# Patient Record
Sex: Female | Born: 1960 | Race: Black or African American | Hispanic: No | Marital: Single | Smoking: Never smoker
Health system: Southern US, Community
[De-identification: ages and names within clinical notes are randomized; demographics above are authoritative.]

## PROBLEM LIST (undated history)

## (undated) DIAGNOSIS — K5792 Diverticulitis of intestine, part unspecified, without perforation or abscess without bleeding: Secondary | ICD-10-CM

## (undated) DIAGNOSIS — R9439 Abnormal result of other cardiovascular function study: Secondary | ICD-10-CM

## (undated) DIAGNOSIS — E782 Mixed hyperlipidemia: Secondary | ICD-10-CM

## (undated) DIAGNOSIS — I422 Other hypertrophic cardiomyopathy: Secondary | ICD-10-CM

## (undated) DIAGNOSIS — I517 Cardiomegaly: Secondary | ICD-10-CM

## (undated) DIAGNOSIS — F419 Anxiety disorder, unspecified: Secondary | ICD-10-CM

## (undated) HISTORY — DX: Anxiety disorder, unspecified: F41.9

## (undated) HISTORY — DX: Cardiomegaly: I51.7

## (undated) HISTORY — PX: ABDOMINAL HYSTERECTOMY: SUR658

## (undated) HISTORY — DX: Mixed hyperlipidemia: E78.2

## (undated) HISTORY — DX: Diverticulitis of intestine, part unspecified, without perforation or abscess without bleeding: K57.92

## (undated) HISTORY — PX: CHOLECYSTECTOMY, LAPAROSCOPIC: SHX56

## (undated) HISTORY — DX: Other hypertrophic cardiomyopathy: I42.2

## (undated) HISTORY — PX: TOOTH EXTRACTION: SUR596

## (undated) HISTORY — DX: Abnormal result of other cardiovascular function study: R94.39

---

## 2004-09-22 ENCOUNTER — Ambulatory Visit (HOSPITAL_COMMUNITY): Admission: RE | Admit: 2004-09-22 | Discharge: 2004-09-22 | Payer: Self-pay | Admitting: Gastroenterology

## 2004-11-17 ENCOUNTER — Inpatient Hospital Stay (HOSPITAL_COMMUNITY): Admission: EM | Admit: 2004-11-17 | Discharge: 2004-11-20 | Payer: Self-pay | Admitting: Emergency Medicine

## 2018-03-24 ENCOUNTER — Other Ambulatory Visit (HOSPITAL_COMMUNITY): Payer: Self-pay | Admitting: Family Medicine

## 2018-03-24 DIAGNOSIS — R011 Cardiac murmur, unspecified: Secondary | ICD-10-CM

## 2018-03-27 ENCOUNTER — Ambulatory Visit (HOSPITAL_COMMUNITY)
Admission: RE | Admit: 2018-03-27 | Discharge: 2018-03-27 | Disposition: A | Discharge status: Home / Self Care | Payer: BC Managed Care – PPO | Source: Ambulatory Visit | Attending: Family Medicine | Admitting: Family Medicine

## 2018-03-27 DIAGNOSIS — I340 Nonrheumatic mitral (valve) insufficiency: Secondary | ICD-10-CM | POA: Diagnosis not present

## 2018-03-27 DIAGNOSIS — R011 Cardiac murmur, unspecified: Secondary | ICD-10-CM | POA: Insufficient documentation

## 2018-03-27 NOTE — Progress Notes (Signed)
*   Echocardiogram 2D Echocardiogram has been performed.  Ramey Schiff L Androw 03/27/2018, 10:58 AM

## 2018-04-07 ENCOUNTER — Other Ambulatory Visit (HOSPITAL_COMMUNITY): Payer: Self-pay | Admitting: Family Medicine

## 2018-04-07 DIAGNOSIS — R931 Abnormal findings on diagnostic imaging of heart and coronary circulation: Secondary | ICD-10-CM

## 2018-04-17 ENCOUNTER — Other Ambulatory Visit (HOSPITAL_COMMUNITY): Payer: BC Managed Care – PPO

## 2018-04-23 ENCOUNTER — Ambulatory Visit (HOSPITAL_COMMUNITY)
Admission: RE | Admit: 2018-04-23 | Discharge: 2018-04-23 | Disposition: A | Discharge status: Home / Self Care | Payer: BC Managed Care – PPO | Source: Ambulatory Visit | Attending: Family Medicine | Admitting: Family Medicine

## 2018-04-23 DIAGNOSIS — R931 Abnormal findings on diagnostic imaging of heart and coronary circulation: Secondary | ICD-10-CM | POA: Insufficient documentation

## 2018-04-23 DIAGNOSIS — I517 Cardiomegaly: Secondary | ICD-10-CM | POA: Diagnosis not present

## 2018-04-23 DIAGNOSIS — I340 Nonrheumatic mitral (valve) insufficiency: Secondary | ICD-10-CM | POA: Diagnosis not present

## 2018-04-23 MED ORDER — GADOBUTROL 1 MMOL/ML IV SOLN
10.0000 mL | Freq: Once | INTRAVENOUS | Status: AC | PRN
  Administered 2018-04-23: 10 mL via INTRAVENOUS

## 2018-07-24 ENCOUNTER — Other Ambulatory Visit (HOSPITAL_COMMUNITY): Payer: Self-pay | Admitting: Cardiology

## 2018-07-24 DIAGNOSIS — R9430 Abnormal result of cardiovascular function study, unspecified: Secondary | ICD-10-CM

## 2018-08-01 ENCOUNTER — Other Ambulatory Visit (HOSPITAL_COMMUNITY): Payer: Self-pay | Admitting: Cardiology

## 2018-08-02 LAB — BASIC METABOLIC PANEL
BUN / CREAT RATIO: 15 (ref 9–23)
BUN: 12 mg/dL (ref 6–24)
CALCIUM: 9.6 mg/dL (ref 8.7–10.2)
CO2: 26 mmol/L (ref 20–29)
Chloride: 103 mmol/L (ref 96–106)
Creatinine, Ser: 0.82 mg/dL (ref 0.57–1.00)
GFR calc non Af Amer: 79 mL/min/{1.73_m2} (ref >59)
GFR, EST AFRICAN AMERICAN: 91 mL/min/{1.73_m2} (ref >59)
GLUCOSE: 102 mg/dL — AB (ref 65–99)
POTASSIUM: 4.2 mmol/L (ref 3.5–5.2)
Sodium: 142 mmol/L (ref 134–144)

## 2018-08-05 ENCOUNTER — Telehealth (HOSPITAL_COMMUNITY): Payer: Self-pay | Admitting: Emergency Medicine

## 2018-08-05 NOTE — Telephone Encounter (Signed)
Reaching out to patient to offer assistance regarding upcoming cardiac imaging study; pt verbalizes understanding of appt date/time, parking situation and where to check in, pre-test NPO status and medications ordered, and verified current allergies; name and call back number provided for further questions should they arise Mychal Decarlo RN Navigator Cardiac [facility] [phone] office [phone] cell 

## 2018-08-07 ENCOUNTER — Ambulatory Visit (HOSPITAL_COMMUNITY)
Admission: RE | Admit: 2018-08-07 | Discharge: 2018-08-07 | Disposition: A | Discharge status: Home / Self Care | Payer: BC Managed Care – PPO | Source: Ambulatory Visit | Attending: Cardiology | Admitting: Cardiology

## 2018-08-07 ENCOUNTER — Encounter (HOSPITAL_COMMUNITY): Payer: Self-pay

## 2018-08-07 DIAGNOSIS — R9430 Abnormal result of cardiovascular function study, unspecified: Secondary | ICD-10-CM | POA: Insufficient documentation

## 2018-08-07 MED ORDER — NITROGLYCERIN 0.4 MG SL SUBL
SUBLINGUAL_TABLET | SUBLINGUAL | Status: AC
  Administered 2018-08-07: 0.8 mg via SUBLINGUAL
  Filled 2018-08-07: qty 2

## 2018-08-07 MED ORDER — NITROGLYCERIN 0.4 MG SL SUBL
0.8000 mg | SUBLINGUAL_TABLET | Freq: Once | SUBLINGUAL | Status: AC
  Administered 2018-08-07: 0.8 mg via SUBLINGUAL
  Filled 2018-08-07: qty 25

## 2018-08-07 MED ORDER — METOPROLOL TARTRATE 5 MG/5ML IV SOLN
INTRAVENOUS | Status: AC
  Filled 2018-08-07: qty 5

## 2018-08-07 MED ORDER — METOPROLOL TARTRATE 5 MG/5ML IV SOLN
5.0000 mg | INTRAVENOUS | Status: DC | PRN
  Administered 2018-08-07: 5 mg via INTRAVENOUS
  Filled 2018-08-07: qty 5

## 2018-08-07 MED ORDER — IOPAMIDOL (ISOVUE-370) INJECTION 76%
80.0000 mL | Freq: Once | INTRAVENOUS | Status: AC | PRN
  Administered 2018-08-07: 80 mL via INTRAVENOUS

## 2018-08-08 ENCOUNTER — Encounter: Payer: Self-pay | Admitting: Cardiology

## 2018-08-08 ENCOUNTER — Ambulatory Visit: Payer: BC Managed Care – PPO | Admitting: Cardiology

## 2018-08-08 VITALS — BP 116/80 | HR 76 | Ht 66.0 in | Wt 218.3 lb

## 2018-08-08 DIAGNOSIS — E782 Mixed hyperlipidemia: Secondary | ICD-10-CM | POA: Diagnosis not present

## 2018-08-08 DIAGNOSIS — I422 Other hypertrophic cardiomyopathy: Secondary | ICD-10-CM | POA: Diagnosis not present

## 2018-08-08 DIAGNOSIS — F419 Anxiety disorder, unspecified: Secondary | ICD-10-CM

## 2018-08-08 MED ORDER — DILTIAZEM HCL ER COATED BEADS 120 MG PO CP24: 120.0000 mg | ORAL_CAPSULE | Freq: Every day | ORAL | 3 refills | Status: DC

## 2018-08-08 MED ORDER — PROPRANOLOL HCL 20 MG PO TABS
20.0000 mg | ORAL_TABLET | Freq: Two times a day (BID) | ORAL | 3 refills | Status: DC | PRN
Start: 2018-08-08 — End: 2020-01-29

## 2018-08-08 NOTE — Patient Instructions (Addendum)
Take diltiazem 120 mg every day. Take propranolol 20 mg twice daily as needed for fast heart rate or anxiety. Stop aspirin. Stop metoprolol  Keep yourself well hydrated. Walk every day.  I will see you back in 3 months

## 2018-08-08 NOTE — Progress Notes (Signed)
Patient is here for follow up visit.  Subjective:   @Patient  ID: Tina Houston, female    DOB: Sep 16, 1960, 58 y.o.   MRN: [id number]  Chief Complaint  Patient presents with  . Cardiomyopathy    3 month F/U    HPI    58 year old African-American female with hypertrophic cardiomyopathy, hyperlipidemia, here for follow up.  Patient underwent exercise treadmill stress test that showed no arrhythmias, but did show diffuse ST-T changes, with low level exercise. Due to this, I recommended coronary CTA that showed no CAD.  Patient is here to discuss the results. She denies chest pain, shortness of breath, palpitations, leg edema, orthopnea, PND, TIA/syncope. She has had significant anxiety since she has been diagnosed with HCM.  She has requested her siblings to be screened for HCM. Patient was taking metoprolol tartarate 25 mg daily, instead of metoprolol succinate. She has noticed sleepiness since starting metoprolol.     Past Medical History:  Diagnosis Date  . Abnormal stress ECG with treadmill   . Anxiety   . Diverticulitis   . Hypertrophic cardiomegaly   . Mixed hyperlipidemia     Past Surgical History:  Procedure Laterality Date  . ABDOMINAL HYSTERECTOMY    . CHOLECYSTECTOMY, LAPAROSCOPIC      Social History   Socioeconomic History  . Marital status: Single    Spouse name: Not on file  . Number of children: Not on file  . Years of education: Not on file  . Highest education level: Not on file  Occupational History  . Not on file  Social Needs  . Financial resource strain: Not on file  . Food insecurity:    Worry: Not on file    Inability: Not on file  . Transportation needs:    Medical: Not on file    Non-medical: Not on file  Tobacco Use  . Smoking status: Never Smoker  . Smokeless tobacco: Never Used  Substance and Sexual Activity  . Alcohol use: Yes    Frequency: Never    Comment: occasional, 1 glass of wine per year  . Drug use: Never  .  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 aspirin 81 MG chewable tablet Chew by mouth daily.    Marland Kitchen. atorvastatin (LIPITOR) 40 MG tablet Take 40 mg by mouth every morning.    Marland Kitchen. co-enzyme Q-10 30 MG capsule Take 100 mg by mouth 2 (two) times daily.    Marland Kitchen. ibuprofen (ADVIL,MOTRIN) 200 MG tablet Take 200 mg by mouth every 6 (six) hours as needed for mild pain.    . metoprolol tartrate (LOPRESSOR) 25 MG tablet Take 25 mg by mouth daily.    . Multiple Vitamin (MULTIVITAMIN) tablet Take 1 tablet by mouth daily.     No current facility-administered medications on file prior to visit.     Cardiovascular studies:  CTA 08/07/2018: 1. Coronary calcium score of 2.75. This was 75th percentile for age and sex matched control.  2. Normal coronary origin with right dominance.  3. No evidence of obstructive CAD.  4. Severe hypertrophy of the basal anteroseptal and inferoseptal myocardium.  5. 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  6. The aortic valve is structurally normal and there is no evidence of a  subvalvular membrane.  7. Systolic anterior motion of the mitral valve  Exercise Treadmill Stress Test 06/27/2018:  Indication: Hypertropic cardiomyopathy The patient exercised on Bruce protocol for  04:30 min. Patient achieved  6.43 METS and reached HR  152 bpm, which is  94 % of maximum age-predicted HR.  Stress test terminated due to fatigue.   Exercise capacity was below average for age. HR Response to Exercise: Appropriate. BP Response to Exercise: Normal resting BP- appropriate response. Chest Pain:  None.  Patient reported exertional dyspnea.  Arrhythmias:  Occasional PAC's. No other arrhtymias seen. Resting EKG  demonstrates Normal sinus rhythm with nonspecific ST-T changes inferior leads. ST Changes: With peak exerercise, there was 1.5 to 2 mm  horizontal ST depressions in leads II, III, aVF, V5-V6, and 1-1.5 mm ST elevations in leds aVR, V1, that persisted 2 min into recovery.  While these changes could be due to patient's hypertrophic cardiomyopathy, obstrcutive coronary artery disease cannot be excluded.   Overall Impression: Abnormal stress test, as detailed above. Recommendations: Consider further  ischemic evaluation.  EKG 05/16/2018: Sinus rhythm 88 bpnm. Normal axis. Normal conduction. LV statin with ST-T changes in leads I, II,, aVL consistent with patient's diagnosis of hypertrophic cardiomyopathy  Cardiac MRI 04/23/2018: IMPRESSION: 1. Severe asymmetric hypertrophy of the basal to mid anteroseptum with mitral valve systolic anterior motion. There is turbulent flow in the LV outflow tract with no subvalvular membrane noted (this suggests LVOT gradient). This constellation of findings is consistent with hypertrophic obstructive cardiomyopathy.   2. Vigorous LV systolic function, EF 78%. Vigorous RV systolic function, EF 67%.   3. Visually, mitral regurgitation (due to mitral valve SAM) appears moderate. However, MV regurgitant fraction calculation give 52% regurgitant fraction. This suggests moderate to severe MR.   4.  There was no myocardial LGE noted.   Overall, this study is most consistent with hypertrophic obstructive cardiomyopathy.  Review of Systems  Constitution: Negative for decreased appetite, malaise/fatigue, weight gain and weight loss.  HENT: Negative for congestion.   Eyes: Negative for visual disturbance.  Cardiovascular: Positive for dyspnea on exertion. Negative for chest pain, leg swelling, palpitations and syncope.  Respiratory: Positive for shortness of breath.   Endocrine: Negative for cold intolerance.  Hematologic/Lymphatic: Does not bruise/bleed easily.   Skin: Negative for itching and rash.  Musculoskeletal: Negative for myalgias.  Gastrointestinal: Negative for abdominal pain, nausea and vomiting.  Genitourinary: Negative for dysuria.  Neurological: Negative for dizziness and weakness.  Psychiatric/Behavioral: The patient is not nervous/anxious.   All other systems reviewed and are negative.      Objective:   Vitals:   08/08/18 0829  BP: 116/80  Pulse: 76  SpO2: 100%     Physical Exam  Constitutional: She is oriented to person, place, and time. She appears well-developed and well-nourished. No distress.  HENT:  Head: Normocephalic and atraumatic.  Eyes: Pupils are equal, round, and reactive to light. Conjunctivae are normal.  Neck: Neck supple. No JVD present.  Cardiovascular: Normal rate, regular rhythm and intact distal pulses.  Murmur (III/VI RUSB ) heard. Pulmonary/Chest: Effort normal and breath sounds normal. She has no wheezes. She has no rales.  Abdominal: Soft. Bowel sounds are normal. There is no rebound.  Musculoskeletal:        General: No edema.  Lymphadenopathy:    She has no cervical adenopathy.  Neurological: She is alert and oriented to person, place, and time. No cranial nerve deficit.  Skin: Skin is warm and dry.  Psychiatric: She  has a normal mood and affect.  Nursing note and vitals reviewed.       Assessment & Recommendations:   58 year old African-American female with hypertrophic cardiomyopathy, hyperlipidemia, here for follow up.  1. Hypertrophic cardiomyopathy (HCC)     No indication for ICD. Diffuse ST-T changes on exercise treadmill stress test due to hypertrophic cardiomyopathy. No CAD found on CTA.  Stop aspirin, given no CAD. Switch metoprolol to diltiazem 120 mg daily due to intolerance. I also added propanolol 20 mg bid. This may help with anxiety as well. Will repeat echocardiogram in 03/2019.   Recommend daily exercise with at least 30 min walking or 15 min high intensity exercise  most days a week.   2. Hyperlipidemia:     Continue Lipitor 40 mg daily.   I will see her back in 3 months.    Elder Negus, MD [facility] Pager: [phone] Office: [phone] If no answer Cell [phone]

## 2018-11-04 ENCOUNTER — Encounter: Payer: Self-pay | Admitting: Cardiology

## 2018-11-04 NOTE — Progress Notes (Addendum)
Patient is here for follow up visit.  Subjective:   @Patient  ID: Tina Houston, female    DOB: Feb 08, 1961, 58 y.o.   MRN: [id number]  I connected with the patient on 11/05/18 by a video enabled telemedicine application and verified that I am speaking with the correct person using two identifiers.     I discussed the limitations of evaluation and management by telemedicine and the availability of in person appointments. The patient expressed understanding and agreed to proceed.   This visit type was conducted due to national recommendations for restrictions regarding the COVID-19 Pandemic (e.g. social distancing).  This format is felt to be most appropriate for this patient at this time.  All issues noted in this document were discussed and addressed.  No physical exam was performed (except for noted visual exam findings with Tele health visits).  The patient has consented to conduct a Tele health visit and understands insurance will be billed.    Chief Complaint  Patient presents with  . Cardiomyopathy    3 month f/u     HPI   58 year old African-American female with hypertrophic cardiomyopathy, hyperlipidemia, here for follow up.  She has been working from home. She walks around the house regularly. She denies chest pain, shortness of breath, palpitations, leg edema, orthopnea, PND, TIA/syncope.   Past Medical History:  Diagnosis Date  . Abnormal stress ECG with treadmill   . Anxiety   . Diverticulitis   . Hypertrophic cardiomegaly   . Mixed hyperlipidemia     Past Surgical History:  Procedure Laterality Date  . ABDOMINAL HYSTERECTOMY    . CHOLECYSTECTOMY, LAPAROSCOPIC    . TOOTH EXTRACTION      Social History   Socioeconomic History  . Marital status: Single    Spouse name: Not on file  . Number of children: Not on file  . Years of education: Not on file  . Highest education level: Not on file  Occupational History  . Not on file  Social Needs  .  Financial resource strain: Not on file  . Food insecurity:    Worry: Not on file    Inability: Not on file  . Transportation needs:    Medical: Not on file    Non-medical: Not on file  Tobacco Use  . Smoking status: Never Smoker  . Smokeless tobacco: Never Used  Substance and Sexual Activity  . Alcohol use: Yes    Frequency: Never    Comment: occasional, 1 glass of wine per year  . Drug use: Never  . 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 Cholecalciferol (VITAMIN D) 50 MCG (2000 UT) CAPS Take by mouth daily.    . Magnesium 200 MG TABS Take by mouth daily.    Marland Kitchen. atorvastatin (LIPITOR) 40 MG tablet Take 40 mg by mouth every morning.    Marland Kitchen. co-enzyme Q-10 30 MG capsule Take 200 mg by mouth daily.    Marland Kitchen. diltiazem (CARDIZEM CD) 120 MG 24 hr capsule Take 1 capsule (120 mg total) by mouth daily. 30 capsule 3  . ibuprofen (ADVIL,MOTRIN) 200 MG tablet Take 200 mg by mouth every 6 (six) hours as needed for mild pain.    . Multiple Vitamin (MULTIVITAMIN) tablet Take 1 tablet by mouth daily.    . propranolol (INDERAL) 20 MG tablet Take 1  tablet (20 mg total) by mouth every 12 (twelve) hours as needed. 60 tablet 3   No current facility-administered medications on file prior to visit.     Cardiovascular studies:  CTA 08/07/2018: 1. Coronary calcium score of 2.75. This was 75th percentile for age and sex matched control.  2. Normal coronary origin with right dominance.  3. No evidence of obstructive CAD.  4. Severe hypertrophy of the basal anteroseptal and inferoseptal myocardium.  5. 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  6. The aortic valve is structurally normal and there is no evidence of a subvalvular membrane.   7. Systolic anterior motion of the mitral valve  Exercise Treadmill Stress Test 06/27/2018:  Indication: Hypertropic cardiomyopathy The patient exercised on Bruce protocol for  04:30 min. Patient achieved  6.43 METS and reached HR  152 bpm, which is  94 % of maximum age-predicted HR.  Stress test terminated due to fatigue.   Exercise capacity was below average for age. HR Response to Exercise: Appropriate. BP Response to Exercise: Normal resting BP- appropriate response. Chest Pain:  None.  Patient reported exertional dyspnea.  Arrhythmias:  Occasional PAC's. No other arrhtymias seen. Resting EKG demonstrates Normal sinus rhythm with nonspecific ST-T changes inferior leads. ST Changes: With peak exerercise, there was 1.5 to 2 mm  horizontal ST depressions in leads II, III, aVF, V5-V6, and 1-1.5 mm ST elevations in leds aVR, V1, that persisted 2 min into recovery.  While these changes could be due to patient's hypertrophic cardiomyopathy, obstrcutive coronary artery disease cannot be excluded.   Overall Impression: Abnormal stress test, as detailed above. Recommendations: Consider further  ischemic evaluation.  EKG 05/16/2018: Sinus rhythm 88 bpnm. Normal axis. Normal conduction. LV statin with ST-T changes in leads I, II,, aVL consistent with patient's diagnosis of hypertrophic cardiomyopathy  Cardiac MRI 04/23/2018: IMPRESSION: 1. Severe asymmetric hypertrophy of the basal to mid anteroseptum with mitral valve systolic anterior motion. There is turbulent flow in the LV outflow tract with no subvalvular membrane noted (this suggests LVOT gradient). This constellation of findings is consistent with hypertrophic obstructive cardiomyopathy.   2. Vigorous LV systolic function, EF 78%. Vigorous RV systolic function, EF 67%.   3. Visually, mitral regurgitation (due to mitral valve SAM) appears moderate. However, MV regurgitant fraction calculation give 52% regurgitant fraction. This  suggests moderate to severe MR.   4.  There was no myocardial LGE noted.   Overall, this study is most consistent with hypertrophic obstructive cardiomyopathy.  Review of Systems  Constitution: Negative for decreased appetite, malaise/fatigue, weight gain and weight loss.  HENT: Negative for congestion.   Eyes: Negative for visual disturbance.  Cardiovascular: Negative for chest pain, dyspnea on exertion, leg swelling, palpitations and syncope.  Respiratory: Negative for shortness of breath.   Endocrine: Negative for cold intolerance.  Hematologic/Lymphatic: Does not bruise/bleed easily.  Skin: Negative for itching and rash.  Musculoskeletal: Negative for myalgias.  Gastrointestinal: Negative for abdominal pain, nausea and vomiting.  Genitourinary: Negative for dysuria.  Neurological: Negative for dizziness and weakness.  Psychiatric/Behavioral: The patient is not nervous/anxious.   All other systems reviewed and are negative.      Objective:   There were no vitals filed for this visit.   Physical Exam  Constitutional: She is oriented to person, place, and time. She appears well-developed and well-nourished. No distress.  Pulmonary/Chest: Effort normal.  Neurological: She is alert and oriented to person,  place, and time.  Psychiatric: She has a normal mood and affect.  Nursing note and vitals reviewed.       Assessment & Recommendations:   58 year old African-American female with hypertrophic cardiomyopathy, hyperlipidemia, here for follow up.  1. Hypertrophic cardiomyopathy (HCC)     No indication for ICD. Diffuse ST-T changes on exercise treadmill stress test due to hypertrophic cardiomyopathy. No CAD found on CTA.  (2019) Continue diltiazem 120 mg daily. Continue propanolol 20 mg bid prn for anxiety.  2. Mitral regurgitation:     Moderate to severe. Repeat echocardiogram in 03/2019  3. Hyperlipidemia:     Continue Lipitor 40 mg daily.  Follow up in office after  03/2019 echocardiogram   Elder Negus, MD [facility] Pager: [phone] Office: [phone] If no answer Cell [phone]

## 2018-11-05 ENCOUNTER — Encounter: Payer: Self-pay | Admitting: Cardiology

## 2018-11-05 ENCOUNTER — Ambulatory Visit: Payer: BC Managed Care – PPO | Admitting: Cardiology

## 2018-11-05 ENCOUNTER — Other Ambulatory Visit: Payer: Self-pay

## 2018-11-05 VITALS — Ht 66.0 in | Wt 221.0 lb

## 2018-11-05 DIAGNOSIS — I422 Other hypertrophic cardiomyopathy: Secondary | ICD-10-CM

## 2018-11-05 DIAGNOSIS — I340 Nonrheumatic mitral (valve) insufficiency: Secondary | ICD-10-CM | POA: Diagnosis not present

## 2018-11-21 ENCOUNTER — Other Ambulatory Visit: Payer: Self-pay | Admitting: Cardiology

## 2018-11-21 DIAGNOSIS — I422 Other hypertrophic cardiomyopathy: Secondary | ICD-10-CM

## 2019-04-07 ENCOUNTER — Other Ambulatory Visit: Payer: Self-pay

## 2019-04-07 ENCOUNTER — Ambulatory Visit (INDEPENDENT_AMBULATORY_CARE_PROVIDER_SITE_OTHER): Payer: BC Managed Care – PPO

## 2019-04-07 DIAGNOSIS — I422 Other hypertrophic cardiomyopathy: Secondary | ICD-10-CM | POA: Diagnosis not present

## 2019-04-07 NOTE — Progress Notes (Addendum)
Patient is here for follow up visit.  Subjective:   @Patient  ID: Tina Houston, female    DOB: 1961/01/11, 58 y.o.   MRN: [id number]   Chief Complaint  Patient presents with  . Cardiomyopathy  . Mitral Regurgitation  . Follow-up  . Results    echo    HPI   58 year old African-American female with hypertrophic cardiomyopathy, hyperlipidemia, here for follow up.  Recent echocardiogram on 04/07/2019 showed some but resting LVOT max gradient of 13 mmHg, with increase severity of mitral regurgitation from mild to moderate compared to previous study in 2019.  Trace pericardial effusion is also a new finding.  Patient was doing more walking earlier in the year, but has tapered off over the summer. She recently picked up walking about 20 minutes.  She denies any chest pain, shortness of breath.  She has very occasional lightheadedness episode, especially when she is stressed.  She drinks about 64 ounces of water every day.  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  She is going to be bearing mask, face shield, gloves, as well as counting.  Past Medical History:  Diagnosis Date  . Abnormal stress ECG with treadmill   . Anxiety   . Diverticulitis   . Hypertrophic cardiomegaly   . Mixed hyperlipidemia     Past Surgical History:  Procedure Laterality Date  . ABDOMINAL HYSTERECTOMY    . CHOLECYSTECTOMY, LAPAROSCOPIC    . TOOTH EXTRACTION      Social History   Socioeconomic History  . Marital status: Single    Spouse name: Not on file  . Number of children: Not on file  . Years of education: Not on file  . Highest education level: Not on file  Occupational History  . Not on file  Social Needs  . Financial resource strain: Not on file  . Food insecurity:    Worry: Not on file    Inability: Not on file  . Transportation needs:    Medical: Not on file    Non-medical: Not on file  Tobacco Use  . Smoking status: Never  Smoker  . Smokeless tobacco: Never Used  Substance and Sexual Activity  . Alcohol use: Yes    Frequency: Never    Comment: occasional, 1 glass of wine per year  . Drug use: Never  . 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 atorvastatin (LIPITOR) 40 MG tablet Take 40 mg by mouth every morning.    . Cholecalciferol (VITAMIN D) 50 MCG (2000 UT) CAPS Take by mouth daily.    Marland Kitchen co-enzyme Q-10 30 MG capsule Take 200 mg by mouth daily.    Marland Kitchen diltiazem (CARDIZEM CD) 120 MG 24 hr capsule TAKE 1 CAPSULE(120 MG) BY MOUTH DAILY 90 capsule 3  . ibuprofen (ADVIL,MOTRIN) 200 MG tablet Take 200 mg by mouth every 6 (six) hours as needed for mild pain.    . Magnesium 200 MG TABS Take by mouth daily.    . Multiple Vitamin (MULTIVITAMIN) tablet Take 1 tablet by mouth daily.    . propranolol (INDERAL) 20 MG tablet Take 1 tablet (20 mg total) by mouth every 12 (twelve) hours as needed. 60 tablet 3   No current facility-administered medications on file prior to visit.     Cardiovascular  studies:  EKG 04/13/2019: Sinus rhythm 77 bpm.  Left atrial enlargement.  Left ventricular hypertrophy. Diffuse ST-T change likely related to LVH, cannot exclude ischemia.  Echocardiogram 04/07/2019: Left ventricle cavity is normal in size. Severe concentric hypertrophy of the left ventricle (posterior wall and septal thickness 2 cm).  Normal LV systolic function with EF 66%. Normal global wall motion. Doppler evidence of grade II (pseudonormal) diastolic dysfunction, elevated LAP. Left atrial cavity is moderately dilated. Trileaflet aortic valve. Minimal systolic anterior motion (SAM) of mitral valve leaflet. Resting LVOT max gradient of 13 mmHg. Peak velocity through aortic valve likely also emanating from LVOT obstruction. No significant valvular  stenosis seen.  No aortic valve regurgitation noted.  Posteriorly directed, eccentric, moderate (grade III) mitral regurgitation. Trace pericardial effusion. Estimated RA pressure 8 mmHg. Findings are consistent with hypertrophic cardiomyopathy.  Compared to previous study on 03/27/2018, mitral regurgitation is increased from mild to moderate. Trace pericardial effusion is new.   CTA 08/07/2018: 1. Coronary calcium score of 2.75. This was 75th percentile for age and sex matched control. 2. Normal coronary origin with right dominance. 3. No evidence of obstructive CAD. 4. Severe hypertrophy of the basal anteroseptal and inferoseptal myocardium. 5. 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 6. The aortic valve is structurally normal and there is no evidence of a subvalvular membrane. 7. Systolic anterior motion of the mitral valve  Exercise Treadmill Stress Test 06/27/2018:  Indication: Hypertropic cardiomyopathy The patient exercised on Bruce protocol for  04:30 min. Patient achieved  6.43 METS and reached HR  152 bpm, which is  94 % of maximum age-predicted HR.  Stress test terminated due to fatigue.   Exercise capacity was below average for age. HR Response to Exercise: Appropriate. BP Response to Exercise: Normal resting BP- appropriate response. Chest Pain:  None.  Patient reported exertional dyspnea.  Arrhythmias:  Occasional PAC's. No other arrhtymias seen. Resting EKG demonstrates Normal sinus rhythm with nonspecific ST-T changes inferior leads. ST Changes: With peak exerercise, there was 1.5 to 2 mm  horizontal ST depressions in leads II, III, aVF, V5-V6, and 1-1.5 mm ST elevations in leds aVR, V1, that persisted 2 min into recovery.  While these changes could be due to patient's hypertrophic cardiomyopathy, obstrcutive coronary artery disease cannot be excluded.   Overall Impression:  Abnormal stress test, as detailed above. Recommendations: Consider further  ischemic evaluation.  Cardiac MRI 04/23/2018: IMPRESSION: 1. Severe asymmetric hypertrophy of the basal to mid anteroseptum with mitral valve systolic anterior motion. There is turbulent flow in the LV outflow tract with no subvalvular membrane noted (this suggests LVOT gradient). This constellation of findings is consistent with hypertrophic obstructive cardiomyopathy. 2. Vigorous LV systolic function, EF 78%. Vigorous RV systolic function, EF 67%. 3. Visually, mitral regurgitation (due to mitral valve SAM) appears moderate. However, MV regurgitant fraction calculation give 52% regurgitant fraction. This suggests moderate to severe MR. 4.  There was no myocardial LGE noted. Overall, this study is most consistent with hypertrophic obstructive cardiomyopathy.  Review of Systems  Constitution: Negative for decreased appetite, malaise/fatigue, weight gain and weight loss.  HENT: Negative for congestion.   Eyes: Negative for visual disturbance.  Cardiovascular: Negative for chest pain, dyspnea on exertion, leg swelling, palpitations and syncope.  Respiratory: Negative for shortness of breath.   Endocrine: Negative for cold intolerance.  Hematologic/Lymphatic: Does not bruise/bleed easily.  Skin: Negative for itching and rash.  Musculoskeletal: Negative  for myalgias.  Gastrointestinal: Negative for abdominal pain, nausea and vomiting.  Genitourinary: Negative for dysuria.  Neurological: Negative for dizziness and weakness.  Psychiatric/Behavioral: The patient is not nervous/anxious.   All other systems reviewed and are negative.      Objective:   Vitals:   04/13/19 0852 04/13/19 0859  BP: (!) 164/90 131/86  Pulse: 67 77  Temp: (!) 95.4 F (35.2 C)   SpO2: 98%      Physical Exam  Constitutional: She is oriented to person, place, and time. She appears well-developed and well-nourished. No distress.   HENT:  Head: Normocephalic and atraumatic.  Eyes: Pupils are equal, round, and reactive to light. Conjunctivae are normal.  Neck: No JVD present.  Cardiovascular: Normal rate, regular rhythm and intact distal pulses.  Murmur heard. High-pitched midsystolic murmur is present with a grade of 3/6 at the lower left sternal border. Pulmonary/Chest: Effort normal and breath sounds normal. She has no wheezes. She has no rales.  Abdominal: Soft. Bowel sounds are normal. There is no rebound.  Musculoskeletal:        General: No edema.  Lymphadenopathy:    She has no cervical adenopathy.  Neurological: She is alert and oriented to person, place, and time. No cranial nerve deficit.  Skin: Skin is warm and dry.  Psychiatric: She has a normal mood and affect.  Nursing note and vitals reviewed.       Assessment & Recommendations:   58 year old African-American female with hypertrophic cardiomyopathy, hyperlipidemia, here for follow up.  1. Hypertrophic cardiomyopathy (HCC)L: Resting LVOT gradient of about 13 mmHg, mild SAM and grade 3 mitral regurgitation.  No overt episodes of angina, dyspnea, presyncope or syncope.  No indication for myomectomy or septal ablation at this time.  Her resting heart rate in 60s and 70s on diltiazem 120 mg daily.  In the past, metoprolol has made her feel "spaced out".  I will add atenolol 25 mg daily to achieve resting heart rate as close to 50 as possible.  On optimal medical management, I am hopeful that she will not need any immediate surgery at this time.  No indication for ICD at this time.   Encourage increasing fluid intake to 3-4 quarts/day. Encourage regular physical activity with 30 min walking most days/week.  I will see her back in 3 months, repeat echocardiogram in 6 months followed by another office visit.  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  I also recommended that she  get vaccinated for flu.  2. Mitral regurgitation:  Moderate to severe. Repeat echocardiogram in 09/2019  3.  Trace pericardial effusion: Monitor for now.  Repeat echocardiogram in 10/13/2019.  4. Hyperlipidemia:  Continue Lipitor 40 mg daily.    Elder NegusManish J , MD [facility] Pager: [phone] Office: [phone] If no answer Cell [phone]

## 2019-04-13 ENCOUNTER — Ambulatory Visit: Payer: BC Managed Care – PPO | Admitting: Cardiology

## 2019-04-13 ENCOUNTER — Other Ambulatory Visit: Payer: Self-pay

## 2019-04-13 ENCOUNTER — Encounter: Payer: Self-pay | Admitting: Cardiology

## 2019-04-13 VITALS — BP 131/86 | HR 77 | Temp 95.4°F | Ht 66.0 in | Wt 221.0 lb

## 2019-04-13 DIAGNOSIS — I422 Other hypertrophic cardiomyopathy: Secondary | ICD-10-CM | POA: Diagnosis not present

## 2019-04-13 DIAGNOSIS — I340 Nonrheumatic mitral (valve) insufficiency: Secondary | ICD-10-CM | POA: Diagnosis not present

## 2019-04-13 MED ORDER — ATENOLOL 25 MG PO TABS: 25.0000 mg | ORAL_TABLET | Freq: Every day | ORAL | 3 refills | Status: DC

## 2019-07-13 ENCOUNTER — Ambulatory Visit: Payer: BC Managed Care – PPO | Admitting: Cardiology

## 2019-08-09 DIAGNOSIS — E782 Mixed hyperlipidemia: Secondary | ICD-10-CM | POA: Insufficient documentation

## 2019-08-09 NOTE — Progress Notes (Signed)
Patient is here for follow up visit.  Subjective:   @Patient  ID: Tina Houston, female    DOB: [birth year], 59 y.o.   MRN: [id number]   Chief Complaint  Patient presents with  . Cardiomyopathy  . Follow-up    3 month    HPI   59 year old African-American female with hypertrophic cardiomyopathy, moderate mitral regurgitation, hyperlipidemia.  Patient is doing well without ay symptoms of chest pain, shortness of breath, palpitations, leg edema, orthopnea, PND, TIA/syncope. 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 She is thinking of getting an Apple watch.   Current Outpatient Medications on File Prior to Visit  Medication Sig Dispense Refill  . atenolol (TENORMIN) 25 MG tablet Take 1 tablet (25 mg total) by mouth daily. 90 tablet 3  . atorvastatin (LIPITOR) 40 MG tablet Take 40 mg by mouth every morning.    . Cholecalciferol (VITAMIN D) 50 MCG (2000 UT) CAPS Take by mouth daily.    Marland Kitchen co-enzyme Q-10 30 MG capsule Take 200 mg by mouth daily.    Marland Kitchen diltiazem (CARDIZEM CD) 120 MG 24 hr capsule TAKE 1 CAPSULE(120 MG) BY MOUTH DAILY 90 capsule 3  . ibuprofen (ADVIL,MOTRIN) 200 MG tablet Take 200 mg by mouth every 6 (six) hours as needed for mild pain.    . Magnesium 200 MG TABS Take by mouth daily.    . Multiple Vitamin (MULTIVITAMIN) tablet Take 1 tablet by mouth daily.    . propranolol (INDERAL) 20 MG tablet Take 1 tablet (20 mg total) by mouth every 12 (twelve) hours as needed. 60 tablet 3   No current facility-administered medications on file prior to visit.    Cardiovascular studies:  EKG 04/13/2019: Sinus rhythm 77 bpm.  Left atrial enlargement.  Left ventricular hypertrophy. Diffuse ST-T change likely related to LVH, cannot exclude ischemia.  Echocardiogram 04/07/2019: Left ventricle cavity is normal in size. Severe concentric hypertrophy of the left  ventricle (posterior wall and septal thickness 2 cm).  Normal LV systolic function with EF 66%. Normal global wall motion. Doppler evidence of grade II (pseudonormal) diastolic dysfunction, elevated LAP. Left atrial cavity is moderately dilated. Trileaflet aortic valve. Minimal systolic anterior motion (SAM) of mitral valve leaflet. Resting LVOT max gradient of 13 mmHg. Peak velocity through aortic valve likely also emanating from LVOT obstruction. No significant valvular stenosis seen.  No aortic valve regurgitation noted.  Posteriorly directed, eccentric, moderate (grade III) mitral regurgitation. Trace pericardial effusion. Estimated RA pressure 8 mmHg. Findings are consistent with hypertrophic cardiomyopathy.  Compared to previous study on 03/27/2018, mitral regurgitation is increased from mild to moderate. Trace pericardial effusion is new.   CTA 08/07/2018: 1. Coronary calcium score of 2.75. This was 75th percentile for age and sex matched control. 2. Normal coronary origin with right dominance. 3. No evidence of obstructive CAD. 4. Severe hypertrophy of the basal anteroseptal and inferoseptal myocardium. 5. 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 6. The aortic valve is structurally normal and there is no evidence of a subvalvular membrane. 7. Systolic anterior motion of the mitral valve  Exercise Treadmill Stress Test 06/27/2018:  Indication: Hypertropic cardiomyopathy The patient exercised on Bruce protocol for  04:30 min. Patient achieved  6.43 METS and reached HR  152 bpm, which is  94 % of maximum  age-predicted HR.  Stress test terminated due to fatigue.   Exercise capacity was below average for age. HR Response to Exercise: Appropriate. BP Response to Exercise: Normal resting BP- appropriate response. Chest Pain:  None.  Patient reported exertional dyspnea.  Arrhythmias:   Occasional PAC's. No other arrhtymias seen. Resting EKG demonstrates Normal sinus rhythm with nonspecific ST-T changes inferior leads. ST Changes: With peak exerercise, there was 1.5 to 2 mm  horizontal ST depressions in leads II, III, aVF, V5-V6, and 1-1.5 mm ST elevations in leds aVR, V1, that persisted 2 min into recovery.  While these changes could be due to patient's hypertrophic cardiomyopathy, obstrcutive coronary artery disease cannot be excluded.   Overall Impression: Abnormal stress test, as detailed above. Recommendations: Consider further  ischemic evaluation.  Cardiac MRI 04/23/2018: IMPRESSION: 1. Severe asymmetric hypertrophy of the basal to mid anteroseptum with mitral valve systolic anterior motion. There is turbulent flow in the LV outflow tract with no subvalvular membrane noted (this suggests LVOT gradient). This constellation of findings is consistent with hypertrophic obstructive cardiomyopathy. 2. Vigorous LV systolic function, EF 32%. Vigorous RV systolic function, EF 99%. 3. Visually, mitral regurgitation (due to mitral valve SAM) appears moderate. However, MV regurgitant fraction calculation give 52% regurgitant fraction. This suggests moderate to severe MR. 4.  There was no myocardial LGE noted. 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  08/01/2018: Glucose 102, BUN/Cr 12/0.82. EGFR 91. Na/K 142/4.2.   Review of Systems  Cardiovascular: Negative for chest pain, dyspnea on exertion, leg swelling, palpitations and syncope.       Objective:    Vitals:   08/10/19 1035 08/10/19 1056  BP: (!) 145/77 123/78  Pulse: 89 72  Temp: 97.8 F (36.6 C)   SpO2: 100%      Physical Exam  Constitutional: No distress.  Neck: No JVD present.  Cardiovascular: Normal rate, regular rhythm and intact distal pulses.  Murmur heard. High-pitched midsystolic murmur is present with a grade of 3/6 at the lower left sternal  border. Pulmonary/Chest: Effort normal and breath sounds normal. She has no wheezes. She has no rales.  Musculoskeletal:        General: No edema.  Psychiatric: She has a normal mood and affect.  Nursing note and vitals reviewed.       Assessment & Recommendations:   59 year old African-American female with hypertrophic cardiomyopathy, moderate mitral regurgitation, hyperlipidemia  1. Hypertrophic cardiomyopathy (HCC)L: Resting LVOT gradient of about 13 mmHg, mild SAM and grade 3 mitral regurgitation.  No overt episodes of angina, dyspnea, presyncope or syncope.  No indication for myomectomy or septal ablation at this time.   Her resting heart rate in 60s and 70s on diltiazem 120 mg daily and atenolol 25 mg daily Continue the same.   On optimal medical management, I am hopeful that she will not need any immediate surgery at this time.  No indication for ICD at this time.   Encourage increasing fluid intake to 3-4 quarts/day. Encourage regular physical activity with 30 min walking most days/week.  Echocardiogram scheduled in April 2021. I will see her back in 6 months.  2. Mitral regurgitation:  Moderate to severe. Repeat echocardiogram in 09/2019  3.  Trace pericardial effusion: Monitor for now.  Repeat echocardiogram in 10/13/2019.  4. Hyperlipidemia:  Continue Lipitor 40 mg daily.    Nigel Mormon, MD [facility] [place] Pager: [phone] Office: [phone] If no answer Cell [phone]

## 2019-08-10 ENCOUNTER — Encounter: Payer: Self-pay | Admitting: Cardiology

## 2019-08-10 ENCOUNTER — Other Ambulatory Visit: Payer: Self-pay

## 2019-08-10 ENCOUNTER — Ambulatory Visit: Payer: BC Managed Care – PPO | Admitting: Cardiology

## 2019-08-10 VITALS — BP 123/78 | HR 72 | Temp 97.8°F | Ht 66.0 in | Wt 226.8 lb

## 2019-08-10 DIAGNOSIS — I422 Other hypertrophic cardiomyopathy: Secondary | ICD-10-CM

## 2019-08-10 DIAGNOSIS — E782 Mixed hyperlipidemia: Secondary | ICD-10-CM

## 2019-08-10 DIAGNOSIS — I340 Nonrheumatic mitral (valve) insufficiency: Secondary | ICD-10-CM

## 2019-10-05 ENCOUNTER — Ambulatory Visit: Payer: BC Managed Care – PPO

## 2019-10-05 ENCOUNTER — Other Ambulatory Visit: Payer: Self-pay

## 2019-10-05 DIAGNOSIS — I422 Other hypertrophic cardiomyopathy: Secondary | ICD-10-CM

## 2019-10-12 ENCOUNTER — Ambulatory Visit: Payer: BC Managed Care – PPO | Admitting: Cardiology

## 2019-10-19 NOTE — Telephone Encounter (Signed)
I send her this message on My Chart.  Your gradients in the heart has increased, I would recommend you to increase Atenolol to 50 mg twice daily and see how you do.  Will change your appointment with Dr. Linton Ham to 6 weeks. Otherwise no significant change in Echo. Keep yourself well hydrated as well. If you agree, message [place] back and I will send the Rx.

## 2019-10-20 ENCOUNTER — Other Ambulatory Visit: Payer: Self-pay

## 2019-10-20 MED ORDER — ATENOLOL 50 MG PO TABS: 50.0000 mg | ORAL_TABLET | Freq: Two times a day (BID) | ORAL | 0 refills | Status: DC

## 2019-11-19 ENCOUNTER — Other Ambulatory Visit: Payer: Self-pay | Admitting: Cardiology

## 2019-11-19 DIAGNOSIS — I422 Other hypertrophic cardiomyopathy: Secondary | ICD-10-CM

## 2020-01-13 ENCOUNTER — Other Ambulatory Visit: Payer: Self-pay | Admitting: Cardiology

## 2020-01-28 NOTE — Progress Notes (Signed)
Patient is here for follow up visit.  Subjective:   @Patient  ID: Tina Houston, female    DOB: 13-Jul-1960, 59 y.o.   MRN: [id number]   Chief Complaint  Patient presents with  . Cardiomyopathy  . Follow-up    results    HPI   59 year old African-American female with hypertrophic cardiomyopathy, hyperlipidemia  Patient is doing well.  She is through the summer in summer school.  Activities included walking up and down stairs with children.  She denies any complaints of chest pain, shortness of, lightheadedness, syncope.  She is tolerating medications well.  Reviewed recent echocardiogram with the patient, which showed resting max gradient of 80 mmHg.   Current Outpatient Medications on File Prior to Visit  Medication Sig Dispense Refill  . atenolol (TENORMIN) 50 MG tablet TAKE 1 TABLET(50 MG) BY MOUTH TWICE DAILY 180 tablet 0  . atorvastatin (LIPITOR) 80 MG tablet Take 80 mg by mouth daily.    . Cholecalciferol (VITAMIN D) 50 MCG (2000 UT) CAPS Take by mouth daily.    Marland Kitchen co-enzyme Q-10 30 MG capsule Take 200 mg by mouth daily.    Marland Kitchen diltiazem (CARDIZEM CD) 120 MG 24 hr capsule TAKE 1 CAPSULE(120 MG) BY MOUTH DAILY. 90 capsule 0  . ibuprofen (ADVIL,MOTRIN) 200 MG tablet Take 200 mg by mouth every 6 (six) hours as needed for mild pain.    . Multiple Vitamin (MULTIVITAMIN) tablet Take 1 tablet by mouth daily.     No current facility-administered medications on file prior to visit.    Cardiovascular studies:  Echocardiogram 10/05/2019:  Left ventricle cavity is normal in size. Severe concentric hypertrophy of  the left ventricle. Normal global wall motion. Normal LV systolic function  with EF 54%. Doppler evidence of grade II (pseudonormal) diastolic  dysfunction, elevated LAP.  Left atrial cavity is mildly dilated.  Structurally normal trileaflet aortic valve. No significant valvular  stenosis or regurgitation seen. Minimal systolic anterior motion (SAM) of  mitral  valve leaflet. Resting peak transvalvular max gradient of 80 mmHg  emanating from LVOT obstruction.  No significant mitral regurgitation seen.  Mild tricuspid regurgitation. Estimated pulmonary artery systolic pressure  is 30 mmHg.  Compared to previous study on 04/07/2019, LVOT max gradient has increased  from 43 mmHg to 80 mmHg Mitral regurgitation previously noted, was likely  due to admixture of LVOT sampling. No significant mitral regurgitation  appreciated on this study.   EKG 04/13/2019: Sinus rhythm 77 bpm.  Left atrial enlargement.  Left ventricular hypertrophy. Diffuse ST-T change likely related to LVH, cannot exclude ischemia.  CTA 08/07/2018: 1. Coronary calcium score of 2.75. This was 75th percentile for age and sex matched control. 2. Normal coronary origin with right dominance. 3. No evidence of obstructive CAD. 4. Severe hypertrophy of the basal anteroseptal and inferoseptal myocardium. 5. 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 6. The aortic valve is structurally normal and there is no evidence of a subvalvular membrane. 7. Systolic anterior motion of the mitral valve  Exercise Treadmill Stress Test 06/27/2018:  Indication: Hypertropic cardiomyopathy The patient exercised on Bruce protocol for  04:30 min. Patient achieved  6.43 METS and reached HR  152 bpm, which is  94 % of maximum age-predicted HR.  Stress test terminated due to fatigue.   Exercise capacity was below average for age. HR Response to Exercise: Appropriate. BP Response to Exercise: Normal resting BP- appropriate response.  Chest Pain:  None.  Patient reported exertional dyspnea.  Arrhythmias:  Occasional PAC's. No other arrhtymias seen. Resting EKG demonstrates Normal sinus rhythm with nonspecific ST-T changes inferior leads. ST Changes: With peak exerercise, there was 1.5 to 2 mm  horizontal ST  depressions in leads II, III, aVF, V5-V6, and 1-1.5 mm ST elevations in leds aVR, V1, that persisted 2 min into recovery.  While these changes could be due to patient's hypertrophic cardiomyopathy, obstrcutive coronary artery disease cannot be excluded.   Overall Impression: Abnormal stress test, as detailed above. Recommendations: Consider further  ischemic evaluation.  Cardiac MRI 04/23/2018: IMPRESSION: 1. Severe asymmetric hypertrophy of the basal to mid anteroseptum with mitral valve systolic anterior motion. There is turbulent flow in the LV outflow tract with no subvalvular membrane noted (this suggests LVOT gradient). This constellation of findings is consistent with hypertrophic obstructive cardiomyopathy. 2. Vigorous LV systolic function, EF 76%. Vigorous RV systolic function, EF 72%. 3. Visually, mitral regurgitation (due to mitral valve SAM) appears moderate. However, MV regurgitant fraction calculation give 52% regurgitant fraction. This suggests moderate to severe MR. 4.  There was no myocardial LGE noted. 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  08/01/2018: Glucose 102, BUN/Cr 12/0.82. EGFR 91. Na/K 142/4.2.   Review of Systems  Cardiovascular: Negative for chest pain, dyspnea on exertion, leg swelling, palpitations and syncope.       Objective:    Vitals:   01/29/20 0842  BP: (!) 146/84  Pulse: (!) 55  SpO2: 100%     Physical Exam Vitals and nursing note reviewed.  Constitutional:      General: She is not in acute distress. Neck:     Vascular: No JVD.  Cardiovascular:     Rate and Rhythm: Normal rate and regular rhythm.     Pulses: Intact distal pulses.     Heart sounds: Murmur heard. High-pitched midsystolic murmur is present with a grade of 3/6 at the lower left sternal border.   Pulmonary:     Effort: Pulmonary effort is normal.     Breath sounds: Normal breath sounds. No wheezing or rales.          Assessment & Recommendations:   59 year old African-American female with hypertrophic cardiomyopathy, hyperlipidemia  Hypertrophic cardiomyopathy:: Resting LVOT gradient of about 80 mmHg without symptoms.  No overt episodes of angina, dyspnea, presyncope or syncope.   No indication for myomectomy or septal ablation at this time.   Resting heart rate in 50s-60s.   Increase diltiazem to 220 mg daily.   Continue atenolol 50 mg twice daily.   On optimal medical management, I am hopeful that she will not need any immediate surgery at this time. No indication for ICD at this time.   Encourage increasing fluid intake to 3-4 quarts/day. Encourage regular physical activity with 30 min walking most days/week.  Repeat echocardiogram in 3 months.   Follow-up after lab pain   Hyperlipidemia: Continue Lipitor 40 mg daily.    Nigel Mormon, MD [facility] [place] Pager: [phone] Office: [phone] If no answer Cell [phone]

## 2020-01-29 ENCOUNTER — Ambulatory Visit: Payer: BC Managed Care – PPO | Admitting: Cardiology

## 2020-01-29 ENCOUNTER — Encounter: Payer: Self-pay | Admitting: Cardiology

## 2020-01-29 ENCOUNTER — Other Ambulatory Visit: Payer: Self-pay

## 2020-01-29 VITALS — BP 146/84 | HR 55 | Ht 66.0 in | Wt 227.0 lb

## 2020-01-29 DIAGNOSIS — E782 Mixed hyperlipidemia: Secondary | ICD-10-CM

## 2020-01-29 DIAGNOSIS — I340 Nonrheumatic mitral (valve) insufficiency: Secondary | ICD-10-CM

## 2020-01-29 DIAGNOSIS — I422 Other hypertrophic cardiomyopathy: Secondary | ICD-10-CM

## 2020-01-29 MED ORDER — DILTIAZEM HCL ER COATED BEADS 240 MG PO CP24: 240.0000 mg | ORAL_CAPSULE | Freq: Every day | ORAL | 3 refills | Status: DC

## 2020-02-15 ENCOUNTER — Other Ambulatory Visit: Payer: Self-pay | Admitting: Cardiology

## 2020-02-15 DIAGNOSIS — I422 Other hypertrophic cardiomyopathy: Secondary | ICD-10-CM

## 2020-04-10 ENCOUNTER — Other Ambulatory Visit: Payer: Self-pay | Admitting: Cardiology

## 2020-05-05 ENCOUNTER — Ambulatory Visit: Payer: BC Managed Care – PPO

## 2020-05-05 ENCOUNTER — Other Ambulatory Visit: Payer: Self-pay

## 2020-05-05 ENCOUNTER — Ambulatory Visit: Payer: BC Managed Care – PPO | Admitting: Cardiology

## 2020-05-05 DIAGNOSIS — I422 Other hypertrophic cardiomyopathy: Secondary | ICD-10-CM

## 2020-05-13 ENCOUNTER — Ambulatory Visit: Payer: BC Managed Care – PPO | Admitting: Cardiology

## 2020-05-25 ENCOUNTER — Other Ambulatory Visit: Payer: Self-pay

## 2020-05-25 ENCOUNTER — Encounter: Payer: Self-pay | Admitting: Cardiology

## 2020-05-25 ENCOUNTER — Ambulatory Visit: Payer: BC Managed Care – PPO | Admitting: Cardiology

## 2020-05-25 VITALS — BP 135/76 | HR 61 | Resp 16 | Ht 66.0 in | Wt 225.0 lb

## 2020-05-25 DIAGNOSIS — E782 Mixed hyperlipidemia: Secondary | ICD-10-CM

## 2020-05-25 DIAGNOSIS — I340 Nonrheumatic mitral (valve) insufficiency: Secondary | ICD-10-CM

## 2020-05-25 DIAGNOSIS — I422 Other hypertrophic cardiomyopathy: Secondary | ICD-10-CM

## 2020-05-25 NOTE — Progress Notes (Signed)
Patient is here for follow up visit.  Subjective:   @Patient  ID: Tina Houston, female    DOB: 11-29-1960, 59 y.o.   MRN: [id number]   Chief Complaint  Patient presents with  . Cardiomyopathy  . Follow-up    3 month    HPI   59 year old African-American female with hypertrophic cardiomyopathy, hyperlipidemia  Patient is doing well.  She is staying active with her school activities. She denies over shortness of breath, leg edema, presyncope/syncope, palpitations. She keeps herself well hydrated.   Reviewed recent echocardiogram with the patient, details below.   Current Outpatient Medications on File Prior to Visit  Medication Sig Dispense Refill  . atenolol (TENORMIN) 50 MG tablet TAKE 1 TABLET(50 MG) BY MOUTH TWICE DAILY 180 tablet 0  . atorvastatin (LIPITOR) 80 MG tablet Take 80 mg by mouth daily.    . Cholecalciferol (VITAMIN D) 50 MCG (2000 UT) CAPS Take by mouth daily.    Marland Kitchen co-enzyme Q-10 30 MG capsule Take 200 mg by mouth daily.    Marland Kitchen diltiazem (CARDIZEM CD) 240 MG 24 hr capsule Take 1 capsule (240 mg total) by mouth daily. 90 capsule 3  . ibuprofen (ADVIL,MOTRIN) 200 MG tablet Take 200 mg by mouth every 6 (six) hours as needed for mild pain.    . Multiple Vitamin (MULTIVITAMIN) tablet Take 1 tablet by mouth daily.     No current facility-administered medications on file prior to visit.    Cardiovascular studies:  Echocardiogram 05/05/2020:  Left ventricle cavity is normal in size. Moderate concentric hypertrophy  of the left ventricle. Normal global wall motion. Normal LV systolic  function with EF 56%. Indeterminate diastolic filling pattern. Calculated  EF 56%.  Left atrial cavity is mildly dilated.  Structurally normal trileaflet aortic valve. No significant valvular  stenosis or regurgitation seen. Minimal systolic anterior motion (SAM) of  mitral valve leaflet. Resting peak transvalvular max gradient of 39 mmHg  emanating from LVOT obstruction.   Moderate to severe, posteriorly directed mitral regurgitation.  Mild tricuspid regurgitation.  No evidence of pulmonary hypertension.  Compared to previous study on 10/05/2019, LVOT gradient has reduced from 80  mmHg to 39 mmHg. Mitral regurgitation is new. Suspect this was  underestimated on study on 10/05/2019.   EKG 04/13/2019: Sinus rhythm 77 bpm.  Left atrial enlargement.  Left ventricular hypertrophy. Diffuse ST-T change likely related to LVH, cannot exclude ischemia.  CTA 08/07/2018: 1. Coronary calcium score of 2.75. This was 75th percentile for age and sex matched control. 2. Normal coronary origin with right dominance. 3. No evidence of obstructive CAD. 4. Severe hypertrophy of the basal anteroseptal and inferoseptal myocardium. 5. 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 6. The aortic valve is structurally normal and there is no evidence of a subvalvular membrane. 7. Systolic anterior motion of the mitral valve  Exercise Treadmill Stress Test 06/27/2018:  Indication: Hypertropic cardiomyopathy The patient exercised on Bruce protocol for  04:30 min. Patient achieved  6.43 METS and reached HR  152 bpm, which is  94 % of maximum age-predicted HR.  Stress test terminated due to fatigue.   Exercise capacity was below average for age. HR Response to Exercise: Appropriate. BP Response to Exercise: Normal resting BP- appropriate response. Chest Pain:  None.  Patient reported exertional dyspnea.  Arrhythmias:  Occasional PAC's. No other arrhtymias seen. Resting EKG demonstrates Normal sinus rhythm with nonspecific ST-T changes inferior  leads. ST Changes: With peak exerercise, there was 1.5 to 2 mm  horizontal ST depressions in leads II, III, aVF, V5-V6, and 1-1.5 mm ST elevations in leds aVR, V1, that persisted 2 min into recovery.  While these changes could be due to patient's  hypertrophic cardiomyopathy, obstrcutive coronary artery disease cannot be excluded.   Overall Impression: Abnormal stress test, as detailed above. Recommendations: Consider further  ischemic evaluation.  Cardiac MRI 04/23/2018: IMPRESSION: 1. Severe asymmetric hypertrophy of the basal to mid anteroseptum with mitral valve systolic anterior motion. There is turbulent flow in the LV outflow tract with no subvalvular membrane noted (this suggests LVOT gradient). This constellation of findings is consistent with hypertrophic obstructive cardiomyopathy. 2. Vigorous LV systolic function, EF 91%. Vigorous RV systolic function, EF 50%. 3. Visually, mitral regurgitation (due to mitral valve SAM) appears moderate. However, MV regurgitant fraction calculation give 52% regurgitant fraction. This suggests moderate to severe MR. 4.  There was no myocardial LGE noted. 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  08/01/2018: Glucose 102, BUN/Cr 12/0.82. EGFR 91. Na/K 142/4.2.   Review of Systems  Cardiovascular: Negative for chest pain, dyspnea on exertion, leg swelling, palpitations and syncope.       Objective:    Vitals:   05/25/20 0851 05/25/20 0856  BP: (!) 151/80 135/76  Pulse: 61 61  Resp: 16   SpO2: 98% 99%     Physical Exam Vitals and nursing note reviewed.  Constitutional:      General: She is not in acute distress. Neck:     Vascular: No JVD.  Cardiovascular:     Rate and Rhythm: Normal rate and regular rhythm.     Pulses: Intact distal pulses.     Heart sounds: Murmur heard. High-pitched midsystolic murmur is present with a grade of 3/6 at the lower left sternal border.   Pulmonary:     Effort: Pulmonary effort is normal.     Breath sounds: Normal breath sounds. No wheezing or rales.         Assessment & Recommendations:   59 year old African-American female with hypertrophic cardiomyopathy,  hyperlipidemia  Hypertrophic cardiomyopathy:: Resting LVOT gradient of about 43 mmHg without symptoms.  No overt episodes of angina, dyspnea, presyncope or syncope.   No indication for myomectomy or septal ablation at this time.   Resting heart rate in 50s-60s.   Continue diltiazem to 220 mg daily, atenolol 50 mg twice daily.   On optimal medical management, I am hopeful that she will not need any immediate surgery at this time. No indication for ICD at this time.  Encourage increasing fluid intake to 3-4 quarts/day. Encourage regular physical activity with 30 min walking most days/week.  Mitral regurgitation: Moderate to severe, posteriorly directed. Clinically asymptomatic. No indication for early repair. Continue to monitor.   Mixed hyperlipidemia: Continue Lipitor 40 mg daily. Check lipid panel  F/u in 6 months after echocardiogram.  Nigel Mormon, MD [facility] [place] Pager: [phone] Office: [phone] If no answer Cell [phone]

## 2020-06-09 IMAGING — MR MR CARD MORPHOLOGY WO/W CM
2 series · 28 of 28 positions shown · IV contrast (gadavist)
Comparison: none

CLINICAL DATA: Elevated gradient across LV outflow tract without
obvious aortic valve stenosis.

EXAM:
CARDIAC MRI
TECHNIQUE: The patient was scanned on a 1.5 Tesla GE magnet. A dedicated
cardiac coil was used. Functional imaging was done using Fiesta
sequences. [DATE], and 4 chamber views were done to assess for RWMA's.
Modified Supplie rule using a short axis stack was used to
calculate an ejection fraction on a dedicated work station using
Circle software. The patient received 10 cc of Gadavist. After 10
minutes inversion recovery sequences were used to assess for
infiltration and scar tissue.
CONTRAST:  10 cc Gadavist

[Series 3: sagittal localizer_trufi_heart · sagittal · 5.0mm · 1.79mm/px · 25 of 25 slices shown]
[im 1/25]
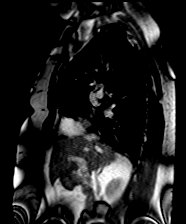
[im 2/25]
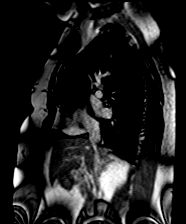
[im 3/25]
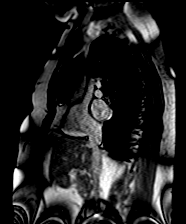
[im 4/25]
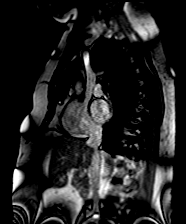
[im 5/25]
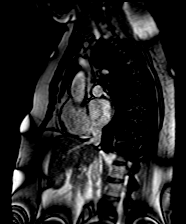
[im 6/25]
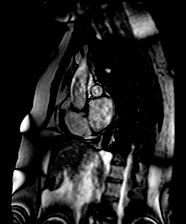
[im 7/25]
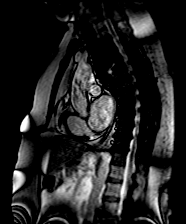
[im 8/25]
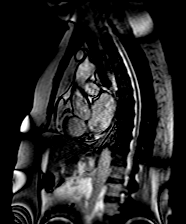
[im 9/25]
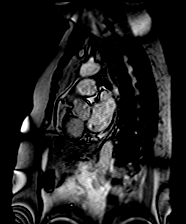
[im 10/25]
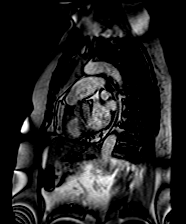
[im 11/25]
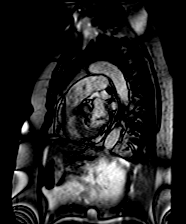
[im 12/25]
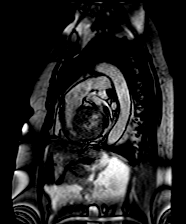
[im 13/25]
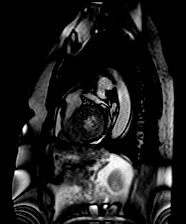
[im 14/25]
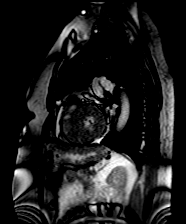
[im 15/25]
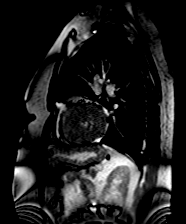
[im 16/25]
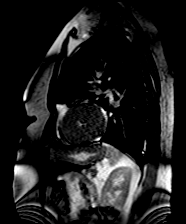
[im 17/25]
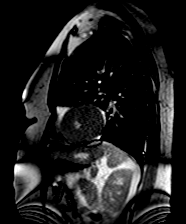
[im 18/25]
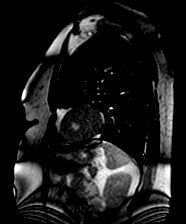
[im 19/25]
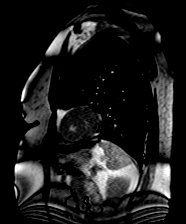
[im 20/25]
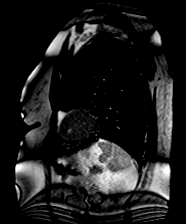
[im 21/25]
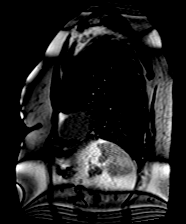
[im 22/25]
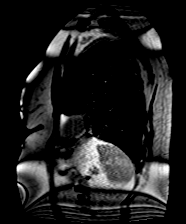
[im 23/25]
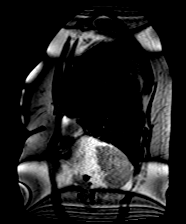
[im 24/25]
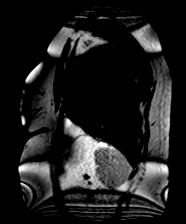
[im 25/25]
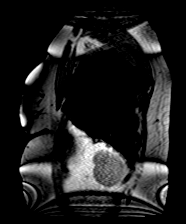

[Series 5: localizer_trufi_2-chamber · oblique · 8.0mm · 1.56mm/px · 3 of 3 slices shown]
[im 1/3]
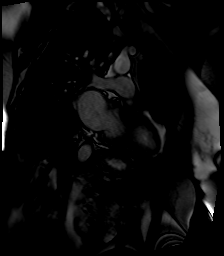
[im 2/3]
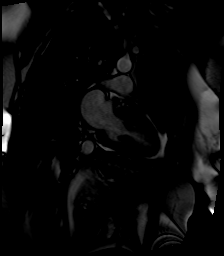
[im 3/3]
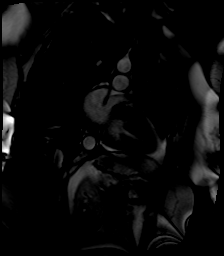

[28 of 28 positions shown; findings below may reference images not displayed]

FINDINGS: Limited images of the lung fields show no gross abnormalities.

Normal left ventricular size with EF 78%, normal wall motion. There
is asymmetric hypertrophy of the basal to mid anteroseptal wall,
reaching 20 mm thickness with 10 mm inferolateral wall. There is LV
outflow tract turbulence noted, no subvalvular membrane visualized.
The right ventricle is normal in size with normal systolic function,
EF 67%. Mildly dilated left atrium, normal right atrial size. There
is systolic anterior motion of the anterior mitral leaflet. There is
moderate mitral regurgitation visually. Trileaflet aortic valve with
no stenosis or regurgitation.

On delayed enhancement imaging, there was no myocardial late
gadolinium enhancement (LGE).

Measurements:

LVEDV 111 mL

LVSV 87 mL

LVEF 78%

RVEDV 89 mL

RVSV 60 mL
RVEF 67%

Aortic valve forward volume 42 mL

MV regurgitant fraction 52%.
IMPRESSION: 1. Severe asymmetric hypertrophy of the basal to mid anteroseptum
with mitral valve systolic anterior motion. There is turbulent flow
in the LV outflow tract with no subvalvular membrane noted (this
suggests LVOT gradient). This constellation of findings is
consistent with hypertrophic obstructive cardiomyopathy.

2. Vigorous LV systolic function, EF 78%. Vigorous RV systolic
function, EF 67%.

3. Visually, mitral regurgitation (due to mitral valve SORIN) appears
moderate. However, MV regurgitant fraction calculation give 52%
regurgitant fraction. This suggests moderate to severe MR.

4.  There was no myocardial LGE noted.

Overall, this study is most consistent with hypertrophic obstructive
cardiomyopathy.

Kojiro Zschau

## 2020-07-07 ENCOUNTER — Other Ambulatory Visit: Payer: Self-pay | Admitting: Cardiology

## 2020-08-29 NOTE — Telephone Encounter (Signed)
From pt

## 2020-11-23 ENCOUNTER — Other Ambulatory Visit: Payer: BC Managed Care – PPO

## 2020-11-28 ENCOUNTER — Ambulatory Visit: Payer: BC Managed Care – PPO | Admitting: Cardiology

## 2020-11-30 ENCOUNTER — Other Ambulatory Visit: Payer: Self-pay

## 2020-11-30 ENCOUNTER — Ambulatory Visit: Payer: BC Managed Care – PPO

## 2020-11-30 DIAGNOSIS — I340 Nonrheumatic mitral (valve) insufficiency: Secondary | ICD-10-CM

## 2020-11-30 DIAGNOSIS — I422 Other hypertrophic cardiomyopathy: Secondary | ICD-10-CM

## 2020-12-12 ENCOUNTER — Ambulatory Visit: Payer: BC Managed Care – PPO | Admitting: Cardiology

## 2020-12-27 NOTE — Progress Notes (Deleted)
Patient is here for follow up visit.  Subjective:   @Patient  ID: Tina Houston, female    DOB: Dec 30, 1960, 60 y.o.   MRN: [id number]  *** No chief complaint on file.   HPI   60 year old African-American female with hypertrophic cardiomyopathy, hyperlipidemia  *** Patient is doing well.  She is staying active with her school activities. She denies over shortness of breath, leg edema, presyncope/syncope, palpitations. She keeps herself well hydrated.   Reviewed recent echocardiogram with the patient, details below.   Current Outpatient Medications on File Prior to Visit  Medication Sig Dispense Refill   atenolol (TENORMIN) 50 MG tablet TAKE 1 TABLET(50 MG) BY MOUTH TWICE DAILY 180 tablet 2   atorvastatin (LIPITOR) 80 MG tablet Take 80 mg by mouth daily.     co-enzyme Q-10 30 MG capsule Take 200 mg by mouth daily.     diltiazem (CARDIZEM CD) 240 MG 24 hr capsule Take 1 capsule (240 mg total) by mouth daily. 90 capsule 3   ibuprofen (ADVIL,MOTRIN) 200 MG tablet Take 200 mg by mouth every 6 (six) hours as needed for mild pain.     Multiple Vitamin (MULTIVITAMIN) tablet Take 1 tablet by mouth daily.     No current facility-administered medications on file prior to visit.    Cardiovascular studies:  ***Echocardiogram 11/30/2020:  Left ventricle cavity is normal in size. Severe asymmetric hypertrophy of  the left ventricle.  Hyperdynamic global wall motion with near mid cavitary obliteration. No  SAM.  Normal LV systolic function with EF 61%.  Indeterminate diastolic filling pattern.  Left atrial cavity is moderately dilated.  Moderate (Grade III) mitral regurgitation.  Estimated pulmonary artery systolic pressure 43 mmHg.  Hypertrophic cardiomyopathy.  No significant change compared to previous study on 05/05/2020.  EKG 04/13/2019: Sinus rhythm 77 bpm.  Left atrial enlargement.  Left ventricular hypertrophy. Diffuse ST-T change likely related to LVH, cannot exclude  ischemia.  CTA 08/07/2018: 1. Coronary calcium score of 2.75. This was 75th percentile for age and sex matched control. 2. Normal coronary origin with right dominance. 3. No evidence of obstructive CAD. 4. Severe hypertrophy of the basal anteroseptal and inferoseptal myocardium. 5. 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 6. The aortic valve is structurally normal and there is no evidence of a subvalvular membrane. 7. Systolic anterior motion of the mitral valve  Exercise Treadmill Stress Test 06/27/2018:  Indication: Hypertropic cardiomyopathy The patient exercised on Bruce protocol for  04:30 min. Patient achieved  6.43 METS and reached HR  152 bpm, which is  62 % of maximum age-predicted HR.  Stress test terminated due to fatigue.   Exercise capacity was below average for age. HR Response to Exercise: Appropriate. BP Response to Exercise: Normal resting BP- appropriate response. Chest Pain:  None.  Patient reported exertional dyspnea.  Arrhythmias:  Occasional PAC's. No other arrhtymias seen. Resting EKG demonstrates Normal sinus rhythm with nonspecific ST-T changes inferior leads. ST Changes: With peak exerercise, there was 1.5 to 2 mm  horizontal ST depressions in leads II, III, aVF, V5-V6, and 1-1.5 mm ST elevations in leds aVR, V1, that persisted 2 min into recovery.  While these changes could be due to patient's hypertrophic cardiomyopathy, obstrcutive coronary artery disease cannot be excluded.   Overall Impression: Abnormal stress test, as detailed above. Recommendations: Consider further  ischemic evaluation.  Cardiac MRI 04/23/2018: IMPRESSION: 1. Severe asymmetric hypertrophy of the  basal to mid anteroseptum with mitral valve systolic anterior motion. There is turbulent flow in the LV outflow tract with no subvalvular membrane noted (this suggests LVOT gradient). This  constellation of findings is consistent with hypertrophic obstructive cardiomyopathy. 2. Vigorous LV systolic function, EF 14%. Vigorous RV systolic function, EF 43%. 3. Visually, mitral regurgitation (due to mitral valve SAM) appears moderate. However, MV regurgitant fraction calculation give 52% regurgitant fraction. This suggests moderate to severe MR. 4.  There was no myocardial LGE noted. 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  08/01/2018: Glucose 102, BUN/Cr 12/0.82. EGFR 91. Na/K 142/4.2.   Review of Systems  Cardiovascular:  Negative for chest pain, dyspnea on exertion, leg swelling, palpitations and syncope.      Objective:   *** There were no vitals filed for this visit.    Physical Exam Vitals and nursing note reviewed.  Constitutional:      General: She is not in acute distress. Neck:     Vascular: No JVD.  Cardiovascular:     Rate and Rhythm: Normal rate and regular rhythm.     Heart sounds: Murmur heard.  Pulmonary:     Effort: Pulmonary effort is normal.     Breath sounds: Normal breath sounds. No wheezing or rales.  Musculoskeletal:     Right lower leg: No edema.     Left lower leg: No edema.        Assessment & Recommendations:   60 year old African-American female with hypertrophic cardiomyopathy, hyperlipidemia  *** Hypertrophic cardiomyopathy:: Resting LVOT gradient of about ***43 mmHg without symptoms.  No overt episodes of angina, dyspnea, presyncope or syncope.   No indication for myomectomy or septal ablation at this time.   Resting heart rate in 50s-60s.   Continue diltiazem to 240 mg daily, atenolol 50 mg twice daily.   On optimal medical management, I am hopeful that she will not need any immediate surgery at this time. No indication for ICD at this time.  Encourage increasing fluid intake to 3-4 quarts/day. Encourage regular physical activity with 30 min walking most  days/week.  *** Mitral regurgitation: Moderate to severe, posteriorly directed. Clinically asymptomatic. No indication for early repair. Continue to monitor.   *** Mixed hyperlipidemia: Continue Lipitor 40 mg daily. Check lipid panel  F/u in ***6 months after echocardiogram.  Nigel Mormon, MD [facility] [place] Pager: [phone] Office: [phone] If no answer Cell [phone]

## 2020-12-30 ENCOUNTER — Ambulatory Visit: Payer: Self-pay | Admitting: Cardiology

## 2021-01-19 ENCOUNTER — Other Ambulatory Visit: Payer: Self-pay | Admitting: Cardiology

## 2021-01-19 DIAGNOSIS — I422 Other hypertrophic cardiomyopathy: Secondary | ICD-10-CM

## 2021-02-06 ENCOUNTER — Ambulatory Visit: Payer: Self-pay | Admitting: Cardiology

## 2021-02-06 ENCOUNTER — Ambulatory Visit: Payer: BC Managed Care – PPO | Admitting: Cardiology

## 2021-02-06 ENCOUNTER — Other Ambulatory Visit: Payer: Self-pay

## 2021-02-06 ENCOUNTER — Encounter: Payer: Self-pay | Admitting: Cardiology

## 2021-02-06 VITALS — BP 131/69 | HR 68 | Temp 98.3°F | Resp 16 | Ht 66.0 in | Wt 221.0 lb

## 2021-02-06 DIAGNOSIS — I340 Nonrheumatic mitral (valve) insufficiency: Secondary | ICD-10-CM

## 2021-02-06 DIAGNOSIS — E782 Mixed hyperlipidemia: Secondary | ICD-10-CM

## 2021-02-06 DIAGNOSIS — I422 Other hypertrophic cardiomyopathy: Secondary | ICD-10-CM

## 2021-02-06 NOTE — Progress Notes (Signed)
Patient is here for follow up visit.  Subjective:   @Patient  ID: Tina Houston, female    DOB: Nov 02, 1960, 60 y.o.   MRN: [id number]   Chief Complaint  Patient presents with   Cardiomyopathy   Nonrheumatic mitral valve regurgitation   Follow-up    73 month    HPI   60 year old African-American female with hypertrophic cardiomyopathy, hyperlipidemia  Patient is doing very well. She has not been walking much outside because of the hot weather. 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 With this level of activity, she denies chest pain, shortness of breath, palpitations, leg edema, orthopnea, PND, TIA/syncope. She is compliant with her medical therapy.    Current Outpatient Medications on File Prior to Visit  Medication Sig Dispense Refill   atenolol (TENORMIN) 50 MG tablet TAKE 1 TABLET(50 MG) BY MOUTH TWICE DAILY 180 tablet 2   atorvastatin (LIPITOR) 80 MG tablet Take 80 mg by mouth daily.     co-enzyme Q-10 30 MG capsule Take 200 mg by mouth daily.     diltiazem (CARDIZEM CD) 240 MG 24 hr capsule TAKE 1 CAPSULE(240 MG) BY MOUTH DAILY 90 capsule 3   ibuprofen (ADVIL,MOTRIN) 200 MG tablet Take 200 mg by mouth every 6 (six) hours as needed for mild pain.     Multiple Vitamin (MULTIVITAMIN) tablet Take 1 tablet by mouth daily.     No current facility-administered medications on file prior to visit.    Cardiovascular studies:  EKG 02/06/2021: Sinus rhythm 72 bpm Left atrial enlargement  Left ventricular hypertophy Inferolateral T wave inversion, consider ischemia  Echocardiogram 11/30/2020:  Left ventricle cavity is normal in size. Severe asymmetric hypertrophy of  the left ventricle.  Hyperdynamic global wall motion with near mid cavitary obliteration. No  SAM.  Normal LV systolic function with EF 61%.  Indeterminate diastolic filling pattern.  Left atrial cavity is moderately  dilated.  Moderate (Grade III) mitral regurgitation.  Estimated pulmonary artery systolic pressure 43 mmHg.  Hypertrophic cardiomyopathy.  No significant change compared to previous study on 05/05/2020.  Echocardiogram 05/05/2020:  Left ventricle cavity is normal in size. Moderate concentric hypertrophy  of the left ventricle. Normal global wall motion. Normal LV systolic  function with EF 56%. Indeterminate diastolic filling pattern. Calculated  EF 56%.  Left atrial cavity is mildly dilated.  Structurally normal trileaflet aortic valve. No significant valvular  stenosis or regurgitation seen.  Minimal systolic anterior motion (SAM) of  mitral valve leaflet. Resting peak transvalvular max gradient of 39 mmHg  emanating from LVOT obstruction.  Moderate to severe, posteriorly directed mitral regurgitation.  Mild tricuspid regurgitation.  No evidence of pulmonary hypertension.  Compared to previous study on 10/05/2019, LVOT gradient has reduced from 80  mmHg to 39 mmHg. Mitral regurgitation is new. Suspect this was  underestimated on study on 10/05/2019.   CTA 08/07/2018: 1. Coronary calcium score of 2.75. This was 75th percentile for age and sex matched control. 2. Normal coronary origin with right dominance. 3. No evidence of obstructive CAD. 4. Severe hypertrophy of the basal anteroseptal and inferoseptal myocardium. 5. 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 6. The aortic valve is structurally normal and there is no evidence of a subvalvular membrane. 7. Systolic anterior motion of the mitral valve  Exercise Treadmill Stress Test 06/27/2018:  Indication: Hypertropic cardiomyopathy The patient exercised on Bruce protocol for  04:30 min. Patient achieved  6.43 METS and reached HR  152 bpm, which is  94 % of maximum age-predicted HR.  Stress test terminated due to fatigue.   Exercise  capacity was below average for age. HR Response to Exercise: Appropriate. BP Response to Exercise: Normal resting BP- appropriate response. Chest Pain:  None.  Patient reported exertional dyspnea.  Arrhythmias:  Occasional PAC's. No other arrhtymias seen. Resting EKG demonstrates Normal sinus rhythm with nonspecific ST-T changes inferior leads. ST Changes: With peak exerercise, there was 1.5 to 2 mm  horizontal ST depressions in leads II, III, aVF, V5-V6, and 1-1.5 mm ST elevations in leds aVR, V1, that persisted 2 min into recovery.  While these changes could be due to patient's hypertrophic cardiomyopathy, obstrcutive coronary artery disease cannot be excluded.   Overall Impression: Abnormal stress test, as detailed above. Recommendations: Consider further  ischemic evaluation.  Cardiac MRI 04/23/2018: IMPRESSION: 1. Severe asymmetric hypertrophy of the basal to mid anteroseptum with mitral valve systolic anterior motion. There is turbulent flow in the LV outflow tract with no subvalvular membrane noted (this suggests LVOT gradient). This constellation of findings is consistent with hypertrophic obstructive cardiomyopathy. 2. Vigorous LV systolic function, EF 52%. Vigorous RV systolic function, EF 77%. 3. Visually, mitral regurgitation (due to mitral valve SAM) appears moderate. However, MV regurgitant fraction calculation give 52% regurgitant fraction. This suggests moderate to severe MR. 4.  There was no myocardial LGE noted. 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  08/01/2018: Glucose 102, BUN/Cr 12/0.82. EGFR 91. Na/K 142/4.2.   Review of Systems  Cardiovascular:  Negative for chest pain, dyspnea on exertion, leg swelling, palpitations and syncope.      Objective:    Vitals:   02/06/21 1050 02/06/21 1057  BP: (!) 142/82 131/69  Pulse: 73 68  Resp: 16   Temp: 98.3 F (36.8 C)   SpO2: 97%      Physical Exam Vitals and  nursing note reviewed.  Constitutional:      General: She is not in acute distress. Neck:     Vascular: No JVD.  Cardiovascular:     Rate and Rhythm: Normal rate and regular rhythm.     Pulses: Intact distal pulses.     Heart sounds: Murmur heard.  High-pitched midsystolic murmur is present with a grade of 3/6 at the lower left sternal border.  Pulmonary:     Effort: Pulmonary effort is normal.     Breath sounds: Normal breath sounds. No wheezing or rales.  Musculoskeletal:     Right lower leg: No edema.     Left lower leg: No edema.        Assessment & Recommendations:   60 year old African-American female with hypertrophic cardiomyopathy, hyperlipidemia  Hypertrophic cardiomyopathy: Clinically, very stable without any obstruction symptoms.  Continue diltiazem to 240 mg daily, atenolol 50 mg twice daily.   On optimal medical management, I am hopeful that she will not need any immediate surgery at this time. No indication for ICD at this time.  Encourage increasing fluid intake to 3-4 quarts/day. Encourage regular physical activity with 30 min walking most days/week.  Mitral regurgitation: Moderate to severe, posteriorly directed. Clinically asymptomatic. No indication for early repair. Continue to monitor.   Mixed hyperlipidemia: Continue Lipitor 40 mg daily.  F/u in 6 months after echocardiogram.  Nigel Mormon, MD [facility] Pager: [phone] Office: [phone] If no answer  Cell [phone]

## 2021-03-30 ENCOUNTER — Other Ambulatory Visit: Payer: Self-pay | Admitting: Cardiology

## 2021-08-10 ENCOUNTER — Other Ambulatory Visit: Payer: BC Managed Care – PPO

## 2021-08-15 ENCOUNTER — Other Ambulatory Visit: Payer: Self-pay

## 2021-08-15 ENCOUNTER — Ambulatory Visit: Payer: BC Managed Care – PPO

## 2021-08-15 DIAGNOSIS — I422 Other hypertrophic cardiomyopathy: Secondary | ICD-10-CM

## 2021-08-18 NOTE — Progress Notes (Signed)
Patient is here for follow up visit.  Subjective:   @Patient  ID: Tina Houston, female    DOB: December 25, 1960, 61 y.o.   MRN: [id number]   Chief Complaint  Patient presents with   Hypertrophic cardiomyopathy    Follow-up    HPI   61 year old African-American female with hypertrophic cardiomyopathy, hyperlipidemia  Patient has not been exercising regularly in the recent past.  She contracted COVID in December.  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  Blood pressure is elevated today, but usually well controlled.  She denies any chest pain, shortness of breath, presyncope, syncope symptoms.  Reviewed recent echo results with the patient, details below.  Current Outpatient Medications on File Prior to Visit  Medication Sig Dispense Refill   atenolol (TENORMIN) 50 MG tablet TAKE 1 TABLET(50 MG) BY MOUTH TWICE DAILY 180 tablet 2   atorvastatin (LIPITOR) 80 MG tablet Take 80 mg by mouth daily.     co-enzyme Q-10 30 MG capsule Take 200 mg by mouth daily.     diltiazem (CARDIZEM CD) 240 MG 24 hr capsule TAKE 1 CAPSULE(240 MG) BY MOUTH DAILY 90 capsule 3   ibuprofen (ADVIL,MOTRIN) 200 MG tablet Take 200 mg by mouth every 6 (six) hours as needed for mild pain.     Multiple Vitamin (MULTIVITAMIN) tablet Take 1 tablet by mouth daily.     No current facility-administered medications on file prior to visit.    Cardiovascular studies:  EKG 08/21/2021: Sinus rhythm 53 bpm Left atrial enlargement.  Left ventricular hypertrophy Diffuse ST depression/TWI likely due to LVH No difference compared to previous EKGs  Echocardiogram 11/30/2020:  Left ventricle cavity is normal in size. Severe asymmetric hypertrophy of  the left ventricle.  Hyperdynamic global wall motion with near mid cavitary obliteration. No  SAM.  Normal LV systolic function with EF 61%.  Indeterminate diastolic filling pattern.  Left atrial cavity is  moderately dilated.  Moderate (Grade III) mitral regurgitation.  Estimated pulmonary artery systolic pressure 43 mmHg.  Hypertrophic cardiomyopathy.  No significant change compared to previous study on 05/05/2020.  CTA 08/07/2018: 1. Coronary calcium score of 2.75. This was 75th percentile for age and sex matched control. 2. Normal coronary origin with right dominance. 3. No evidence of obstructive CAD. 4. Severe hypertrophy of the basal anteroseptal and inferoseptal myocardium. 5. 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 6. The aortic valve is structurally normal and there is no evidence of a subvalvular membrane. 7. Systolic anterior motion of the mitral valve  Exercise Treadmill Stress Test 06/27/2018:  Indication: Hypertropic cardiomyopathy The patient exercised on Bruce protocol for  04:30 min. Patient achieved  6.43 METS and reached HR  152 bpm, which is  94 % of maximum age-predicted HR.  Stress test terminated due to fatigue.   Exercise capacity was below average for age. HR Response to Exercise: Appropriate. BP Response to Exercise: Normal resting BP- appropriate response. Chest Pain:  None.  Patient reported exertional dyspnea.  Arrhythmias:  Occasional PAC's. No other arrhtymias seen. Resting EKG demonstrates Normal sinus rhythm with nonspecific ST-T changes inferior leads. ST Changes: With peak exerercise, there was 1.5 to 2 mm  horizontal ST depressions in leads II, III, aVF, V5-V6, and 1-1.5 mm ST elevations in leds aVR, V1, that persisted 2 min into recovery.  While these changes could be due to  patient's hypertrophic cardiomyopathy, obstrcutive coronary artery disease cannot be excluded.   Overall Impression: Abnormal stress test, as detailed above. Recommendations: Consider further  ischemic evaluation.  Cardiac MRI 04/23/2018: IMPRESSION: 1. Severe asymmetric hypertrophy  of the basal to mid anteroseptum with mitral valve systolic anterior motion. There is turbulent flow in the LV outflow tract with no subvalvular membrane noted (this suggests LVOT gradient). This constellation of findings is consistent with hypertrophic obstructive cardiomyopathy. 2. Vigorous LV systolic function, EF 43%. Vigorous RV systolic function, EF 60%. 3. Visually, mitral regurgitation (due to mitral valve SAM) appears moderate. However, MV regurgitant fraction calculation give 52% regurgitant fraction. This suggests moderate to severe MR. 4.  There was no myocardial LGE noted. 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  Recent labs: 06/30/2021: Glucose 90, BUN/Cr 12/0.9. EGFR 70. K 4.9 HbA1C NA Chol 196, TG 118, HDL 30, LDL 144 TSH 1.1 normal  08/01/2018: Glucose 102, BUN/Cr 12/0.82. EGFR 91. Na/K 142/4.2.   Review of Systems  Cardiovascular:  Negative for chest pain, dyspnea on exertion, leg swelling, palpitations and syncope.      Objective:    Vitals:   08/21/21 0816 08/21/21 0826  BP: (!) 146/81 (!) 154/82  Pulse: (!) 57 (!) 58  Temp: 98 F (36.7 C)   SpO2: 98%      Physical Exam Vitals and nursing note reviewed.  Constitutional:      General: She is not in acute distress. Neck:     Vascular: No JVD.  Cardiovascular:     Rate and Rhythm: Normal rate and regular rhythm.     Pulses: Intact distal pulses.     Heart sounds: Murmur heard.  High-pitched midsystolic murmur is present with a grade of 3/6 at the lower left sternal border.  Pulmonary:     Effort: Pulmonary effort is normal.     Breath sounds: Normal breath sounds. No wheezing or rales.  Musculoskeletal:     Right lower leg: No edema.     Left lower leg: No edema.        Assessment & Recommendations:   61 year old African-American female with hypertrophic cardiomyopathy, hyperlipidemia  Hypertrophic cardiomyopathy: Clinically, very stable  without any obstruction symptoms.  Continue diltiazem to 240 mg daily, atenolol 50 mg twice daily.   On optimal medical management with no indication for hemodialysis or any ablative treatment. No indication for ICD. Encourage increasing fluid intake to 3-4 quarts/day. Encourage regular physical activity with 30 min walking most days/week.  Mitral regurgitation: Moderate. Clinically asymptomatic. No indication for early repair. Continue to monitor.   Mixed hyperlipidemia: Calcium score 2.7 LDL 144 on Lipitor 80 mg daily. Encourage increase physical activity.  In addition, added Zetia 10 mg daily.   Repeat lipid panel in 3 months   F/u in June 2023  Nigel Mormon, MD [facility] Pager: [phone] Office: [phone]

## 2021-08-21 ENCOUNTER — Ambulatory Visit: Payer: BC Managed Care – PPO | Admitting: Cardiology

## 2021-08-21 ENCOUNTER — Other Ambulatory Visit: Payer: Self-pay

## 2021-08-21 ENCOUNTER — Encounter: Payer: Self-pay | Admitting: Cardiology

## 2021-08-21 VITALS — BP 154/82 | HR 58 | Temp 98.0°F | Ht 66.0 in | Wt 216.0 lb

## 2021-08-21 DIAGNOSIS — E782 Mixed hyperlipidemia: Secondary | ICD-10-CM

## 2021-08-21 DIAGNOSIS — I422 Other hypertrophic cardiomyopathy: Secondary | ICD-10-CM

## 2021-08-21 DIAGNOSIS — I340 Nonrheumatic mitral (valve) insufficiency: Secondary | ICD-10-CM

## 2021-08-21 MED ORDER — EZETIMIBE 10 MG PO TABS: 10.0000 mg | ORAL_TABLET | Freq: Every day | ORAL | 3 refills | Status: DC

## 2021-08-26 ENCOUNTER — Encounter: Payer: Self-pay | Admitting: Cardiology

## 2021-08-28 NOTE — Telephone Encounter (Signed)
From patient.

## 2021-12-14 ENCOUNTER — Ambulatory Visit: Payer: BC Managed Care – PPO | Admitting: Cardiology

## 2021-12-14 ENCOUNTER — Other Ambulatory Visit: Payer: Self-pay | Admitting: Gastroenterology

## 2021-12-14 DIAGNOSIS — K56699 Other intestinal obstruction unspecified as to partial versus complete obstruction: Secondary | ICD-10-CM

## 2021-12-22 ENCOUNTER — Other Ambulatory Visit: Payer: Self-pay | Admitting: Cardiology

## 2022-01-04 ENCOUNTER — Ambulatory Visit: Payer: BC Managed Care – PPO | Admitting: Cardiology

## 2022-01-04 ENCOUNTER — Encounter: Payer: Self-pay | Admitting: Cardiology

## 2022-01-04 VITALS — BP 133/75 | HR 62 | Temp 98.0°F | Resp 17 | Ht 66.0 in | Wt 211.4 lb

## 2022-01-04 DIAGNOSIS — I422 Other hypertrophic cardiomyopathy: Secondary | ICD-10-CM

## 2022-01-04 DIAGNOSIS — E782 Mixed hyperlipidemia: Secondary | ICD-10-CM

## 2022-01-04 NOTE — Progress Notes (Signed)
Patient is here for follow up visit.  Subjective:   _0  ID: Tina Houston, female    DOB: Sep 05, 1960, 61 y.o.   MRN: [id number]   Chief Complaint  Patient presents with   Follow-up   Cardiomyopathy   HLD    HPI   61 year old African-American female with hypertrophic cardiomyopathy, hyperlipidemia  Patient is doing well denies any chest pain, shortness of breath, presyncope or syncope symptoms.  She is compliant with medical therapy.   Current Outpatient Medications:    atenolol (TENORMIN) 50 MG tablet, TAKE 1 TABLET(50 MG) BY MOUTH TWICE DAILY, Disp: 180 tablet, Rfl: 2   atorvastatin (LIPITOR) 80 MG tablet, Take 80 mg by mouth daily., Disp: , Rfl:    co-enzyme Q-10 30 MG capsule, Take 200 mg by mouth daily., Disp: , Rfl:    diltiazem (CARDIZEM CD) 240 MG 24 hr capsule, TAKE 1 CAPSULE(240 MG) BY MOUTH DAILY (Patient taking differently: 240 mg daily.), Disp: 90 capsule, Rfl: 3   diphenhydrAMINE (BENADRYL) 12.5 MG chewable tablet, Chew 1 tablet by mouth as needed., Disp: , Rfl:    ezetimibe (ZETIA) 10 MG tablet, Take 1 tablet (10 mg total) by mouth daily., Disp: 90 tablet, Rfl: 3   ibuprofen (ADVIL,MOTRIN) 200 MG tablet, Take 200 mg by mouth every 6 (six) hours as needed for mild pain. (Patient not taking: Reported on 08/21/2021), Disp: , Rfl:    Multiple Vitamin (MULTIVITAMIN) tablet, Take 1 tablet by mouth daily., Disp: , Rfl:   Cardiovascular studies:  EKG 08/21/2021: Sinus rhythm 53 bpm Left atrial enlargement.  Left ventricular hypertrophy Diffuse ST depression/TWI likely due to LVH No difference compared to previous EKGs  Echocardiogram 11/30/2020:  Left ventricle cavity is normal in size. Severe asymmetric hypertrophy of  the left ventricle.  Hyperdynamic global wall motion with near mid cavitary obliteration. No  SAM.  Normal LV systolic function with EF 61%.  Indeterminate diastolic filling pattern.  Left atrial cavity is moderately dilated.  Moderate  (Grade III) mitral regurgitation.  Estimated pulmonary artery systolic pressure 43 mmHg.  Hypertrophic cardiomyopathy.  No significant change compared to previous study on 05/05/2020.  CTA 08/07/2018: 1. Coronary calcium score of 2.75. This was 75th percentile for age and sex matched control. 2. Normal coronary origin with right dominance. 3. No evidence of obstructive CAD. 4. Severe hypertrophy of the basal anteroseptal and inferoseptal myocardium. 5. 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 6. The aortic valve is structurally normal and there is no evidence of a subvalvular membrane. 7. Systolic anterior motion of the mitral valve  Exercise Treadmill Stress Test 06/27/2018:  Indication: Hypertropic cardiomyopathy The patient exercised on Bruce protocol for  04:30 min. Patient achieved  6.43 METS and reached HR  152 bpm, which is  94 % of maximum age-predicted HR.  Stress test terminated due to fatigue.   Exercise capacity was below average for age. HR Response to Exercise: Appropriate. BP Response to Exercise: Normal resting BP- appropriate response. Chest Pain:  None.  Patient reported exertional dyspnea.  Arrhythmias:  Occasional PAC's. No other arrhtymias seen. Resting EKG demonstrates Normal sinus rhythm with nonspecific ST-T changes inferior leads. ST Changes: With peak exerercise, there was 1.5 to 2 mm  horizontal ST depressions in leads II, III, aVF, V5-V6, and 1-1.5 mm ST elevations in leds aVR, V1, that persisted 2 min into recovery.  While these changes could be due to patient's  hypertrophic cardiomyopathy, obstrcutive coronary artery disease cannot be excluded.   Overall Impression: Abnormal stress test, as detailed above. Recommendations: Consider further  ischemic evaluation.  Cardiac MRI 04/23/2018: IMPRESSION: 1. Severe asymmetric hypertrophy of the basal to mid  anteroseptum with mitral valve systolic anterior motion. There is turbulent flow in the LV outflow tract with no subvalvular membrane noted (this suggests LVOT gradient). This constellation of findings is consistent with hypertrophic obstructive cardiomyopathy. 2. Vigorous LV systolic function, EF 96%. Vigorous RV systolic function, EF 29%. 3. Visually, mitral regurgitation (due to mitral valve SAM) appears moderate. However, MV regurgitant fraction calculation give 52% regurgitant fraction. This suggests moderate to severe MR. 4.  There was no myocardial LGE noted. 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  Recent labs: 06/30/2021: Glucose 90, BUN/Cr 12/0.9. EGFR 70. K 4.9 HbA1C NA Chol 196, TG 118, HDL 30, LDL 144 TSH 1.1 normal  08/01/2018: Glucose 102, BUN/Cr 12/0.82. EGFR 91. Na/K 142/4.2.   Review of Systems  Cardiovascular:  Negative for chest pain, dyspnea on exertion, leg swelling, palpitations and syncope.       Objective:    Vitals:   01/04/22 1028  BP: 133/75  Pulse: 62  Resp: 17  Temp: 98 F (36.7 C)  SpO2: 99%     Physical Exam Vitals and nursing note reviewed.  Constitutional:      General: She is not in acute distress. Neck:     Vascular: No JVD.  Cardiovascular:     Rate and Rhythm: Normal rate and regular rhythm.     Pulses: Intact distal pulses.     Heart sounds: Murmur heard.     High-pitched midsystolic murmur is present with a grade of 3/6 at the lower left sternal border.  Pulmonary:     Effort: Pulmonary effort is normal.     Breath sounds: Normal breath sounds. No wheezing or rales.  Musculoskeletal:     Right lower leg: No edema.     Left lower leg: No edema.         Assessment & Recommendations:   61 year old African-American female with hypertrophic cardiomyopathy, hyperlipidemia  Hypertrophic cardiomyopathy: Clinically, very stable without any obstruction symptoms.  Continue  diltiazem to 240 mg daily, atenolol 50 mg twice daily.   On optimal medical management with no indication for hemodialysis or any ablative treatment. No indication for ICD. Continue liberal hydration, regular physical activity.  Mitral regurgitation: Moderate. Clinically asymptomatic. No indication for early repair. Continue to monitor.   Mixed hyperlipidemia: Calcium score 2.7 LDL 144 on Lipitor 80 mg daily.  Added Zetia 10 mg daily at last visit. Check lipid panel.    Echocardiogram and follow-up in 6 months   Betha Shadix Esther Hardy, MD [facility] Pager: [phone] Office: [phone]

## 2022-01-09 LAB — LIPID PANEL
Chol/HDL Ratio: 5.1 ratio — ABNORMAL HIGH (ref 0.0–4.4)
Cholesterol, Total: 177 mg/dL (ref 100–199)
HDL: 35 mg/dL — ABNORMAL LOW (ref >39)
LDL Chol Calc (NIH): 118 mg/dL — ABNORMAL HIGH (ref 0–99)
Triglycerides: 131 mg/dL (ref 0–149)
VLDL Cholesterol Cal: 24 mg/dL (ref 5–40)

## 2022-01-16 ENCOUNTER — Other Ambulatory Visit: Payer: BC Managed Care – PPO

## 2022-01-19 ENCOUNTER — Encounter: Payer: Self-pay | Admitting: Cardiology

## 2022-01-22 ENCOUNTER — Other Ambulatory Visit: Payer: Self-pay | Admitting: Cardiology

## 2022-01-22 DIAGNOSIS — E782 Mixed hyperlipidemia: Secondary | ICD-10-CM

## 2022-01-22 NOTE — Telephone Encounter (Signed)
From pt

## 2022-01-26 ENCOUNTER — Other Ambulatory Visit: Payer: Self-pay | Admitting: Cardiology

## 2022-01-26 DIAGNOSIS — I422 Other hypertrophic cardiomyopathy: Secondary | ICD-10-CM

## 2022-02-06 ENCOUNTER — Other Ambulatory Visit: Payer: BC Managed Care – PPO

## 2022-06-07 ENCOUNTER — Other Ambulatory Visit: Payer: BC Managed Care – PPO

## 2022-06-27 ENCOUNTER — Ambulatory Visit: Payer: BC Managed Care – PPO

## 2022-06-27 DIAGNOSIS — I422 Other hypertrophic cardiomyopathy: Secondary | ICD-10-CM

## 2022-07-01 NOTE — Progress Notes (Addendum)
Patient is here for follow up visit.  Subjective:   @Patient  ID: Tina Houston, female    DOB: [birth year], 62 y.o.   MRN: [id number]   Chief Complaint  Patient presents with   Results   Follow-up    6 months    HPI   62 year old African-American female with hypertrophic cardiomyopathy, hyperlipidemia  Patient is doing well, denies chest pain, shortness of breath, palpitations, leg edema, orthopnea, PND, TIA/syncope.  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  Reviewed recent test results with the patient, details below.    Current Outpatient Medications:    atenolol (TENORMIN) 50 MG tablet, TAKE 1 TABLET(50 MG) BY MOUTH TWICE DAILY, Disp: 180 tablet, Rfl: 2   atorvastatin (LIPITOR) 80 MG tablet, Take 80 mg by mouth daily., Disp: , Rfl:    co-enzyme Q-10 30 MG capsule, Take 200 mg by mouth daily., Disp: , Rfl:    diltiazem (CARDIZEM CD) 240 MG 24 hr capsule, TAKE 1 CAPSULE(240 MG) BY MOUTH DAILY, Disp: 90 capsule, Rfl: 3   diphenhydrAMINE (BENADRYL) 12.5 MG chewable tablet, Chew 1 tablet by mouth as needed., Disp: , Rfl:    ezetimibe (ZETIA) 10 MG tablet, Take 1 tablet (10 mg total) by mouth daily., Disp: 90 tablet, Rfl: 3   ibuprofen (ADVIL,MOTRIN) 200 MG tablet, Take 200 mg by mouth every 6 (six) hours as needed for mild pain., Disp: , Rfl:    Multiple Vitamin (MULTIVITAMIN) tablet, Take 1 tablet by mouth daily., Disp: , Rfl:   Cardiovascular studies:  EKG 07/09/2022: Sinus rhythm 61 bpm LVH IVCD  Echocardiogram 06/27/2022: Left ventricle cavity is normal in size. Severe asymmetric hypertrophy of the left ventricle. Septum measures 2.0 cm. Normal global wall motion. Hyperdynamic LV systolic function with visual EF >70%. Grade II diastolic dysfunction, elevated LAP. At least mild LVOT obstruction. LVOT gradient not adequately assessed. Left atrial cavity is mildly dilated. Moderate (Grade II) mitral regurgitation. Mild  tricuspid regurgitation. Previous study on 08/15/2021 reported indeterminate diastolic function, estimated PASP 43 mmHg.  CTA 08/07/2018: 1. Coronary calcium score of 2.75. This was 75th percentile for age and sex matched control. 2. Normal coronary origin with right dominance. 3. No evidence of obstructive CAD. 4. Severe hypertrophy of the basal anteroseptal and inferoseptal myocardium. 5. 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 6. The aortic valve is structurally normal and there is no evidence of a subvalvular membrane. 7. Systolic anterior motion of the mitral valve  Exercise Treadmill Stress Test 06/27/2018:  Indication: Hypertropic cardiomyopathy The patient exercised on Bruce protocol for  04:30 min. Patient achieved  6.43 METS and reached HR  152 bpm, which is  94 % of maximum age-predicted HR.  Stress test terminated due to fatigue.   Exercise capacity was below average for age. HR Response to Exercise: Appropriate. BP Response to Exercise: Normal resting BP- appropriate response. Chest Pain:  None.  Patient reported exertional dyspnea.  Arrhythmias:  Occasional PAC's. No other arrhtymias seen. Resting EKG demonstrates Normal sinus rhythm with nonspecific ST-T changes inferior leads. ST Changes: With peak exerercise, there was 1.5 to 2 mm  horizontal ST depressions in leads II, III, aVF, V5-V6, and 1-1.5 mm ST elevations in leds aVR, V1, that persisted 2 min into recovery.  While these changes could be due to patient's hypertrophic cardiomyopathy, obstrcutive coronary artery disease cannot be excluded.   Overall Impression:  Abnormal stress test, as detailed above. Recommendations: Consider further  ischemic evaluation.  Cardiac MRI 04/23/2018: IMPRESSION: 1. Severe asymmetric hypertrophy of the basal to mid anteroseptum with mitral valve systolic anterior motion. There is  turbulent flow in the LV outflow tract with no subvalvular membrane noted (this suggests LVOT gradient). This constellation of findings is consistent with hypertrophic obstructive cardiomyopathy. 2. Vigorous LV systolic function, EF 78%. Vigorous RV systolic function, EF 67%. 3. Visually, mitral regurgitation (due to mitral valve SAM) appears moderate. However, MV regurgitant fraction calculation give 52% regurgitant fraction. This suggests moderate to severe MR. 4.  There was no myocardial LGE noted. 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  Recent labs: 01/08/2022: Chol 177, TG 131, HDL 35, LDL 118  06/30/2021: Glucose 90, BUN/Cr 12/0.9. EGFR 70. K 4.9 HbA1C NA Chol 196, TG 118, HDL 30, LDL 144 TSH 1.1 normal  08/01/2018: Glucose 102, BUN/Cr 12/0.82. EGFR 91. Na/K 142/4.2.   Review of Systems  Cardiovascular:  Negative for chest pain, dyspnea on exertion, leg swelling, palpitations and syncope.       Objective:    Vitals:   07/09/22 0812  BP: 126/64  Pulse: 64  Resp: 16  SpO2: 98%     Physical Exam Vitals and nursing note reviewed.  Constitutional:      General: She is not in acute distress. Neck:     Vascular: No JVD.  Cardiovascular:     Rate and Rhythm: Normal rate and regular rhythm.     Pulses: Intact distal pulses.     Heart sounds: Murmur heard.     High-pitched midsystolic murmur is present with a grade of 3/6 at the lower left sternal border.  Pulmonary:     Effort: Pulmonary effort is normal.     Breath sounds: Normal breath sounds. No wheezing or rales.  Musculoskeletal:     Right lower leg: No edema.     Left lower leg: No edema.         Assessment & Recommendations:   62 year old African-American female with hypertrophic cardiomyopathy, hyperlipidemia  Hypertrophic cardiomyopathy: She has not had much water to drink today, and as result, I think her murmur is more prominent.  However, no  significant change in serial echocardiograms.  Encourage liberal hydration. Clinically, very stable without any obstruction symptoms.  Continue diltiazem to 240 mg daily, atenolol 50 mg twice daily.   On optimal medical management with no indication for hemodialysis or any ablative treatment. No indication for ICD.  Mitral regurgitation: Moderate. Clinically asymptomatic. No indication for early repair. Continue to monitor.   Mixed hyperlipidemia: Calcium score 2.7 (07/2018) Chol 177, TG 131, HDL 35, LDL 118 (12/2021) on Lipitor 80 mg daily, Zetia 10 mg daily Check lipid panel.  Echocardiogram and f/u in 1 year  Elder Negus, MD [facility] Pager: [phone] Office: [phone]

## 2022-07-06 ENCOUNTER — Ambulatory Visit: Payer: BC Managed Care – PPO | Admitting: Cardiology

## 2022-07-09 ENCOUNTER — Ambulatory Visit: Payer: BC Managed Care – PPO | Admitting: Cardiology

## 2022-07-09 ENCOUNTER — Encounter: Payer: Self-pay | Admitting: Cardiology

## 2022-07-09 VITALS — BP 126/64 | HR 64 | Resp 16 | Ht 66.0 in | Wt 211.0 lb

## 2022-07-09 DIAGNOSIS — I422 Other hypertrophic cardiomyopathy: Secondary | ICD-10-CM

## 2022-07-09 DIAGNOSIS — I340 Nonrheumatic mitral (valve) insufficiency: Secondary | ICD-10-CM

## 2022-07-09 DIAGNOSIS — E782 Mixed hyperlipidemia: Secondary | ICD-10-CM

## 2022-07-09 MED ORDER — ATORVASTATIN CALCIUM 80 MG PO TABS: 80.0000 mg | ORAL_TABLET | Freq: Every day | ORAL | 3 refills | Status: DC

## 2022-07-09 MED ORDER — ATENOLOL 50 MG PO TABS: ORAL_TABLET | ORAL | 2 refills | Status: DC

## 2022-07-09 MED ORDER — DILTIAZEM HCL ER COATED BEADS 240 MG PO CP24: ORAL_CAPSULE | ORAL | 3 refills | Status: DC

## 2022-07-09 MED ORDER — EZETIMIBE 10 MG PO TABS: 10.0000 mg | ORAL_TABLET | Freq: Every day | ORAL | 3 refills | Status: DC

## 2022-10-27 LAB — LIPID PANEL
Chol/HDL Ratio: 4.1 ratio (ref 0.0–4.4)
Cholesterol, Total: 144 mg/dL (ref 100–199)
HDL: 35 mg/dL — ABNORMAL LOW (ref >39)
LDL Chol Calc (NIH): 86 mg/dL (ref 0–99)
Triglycerides: 130 mg/dL (ref 0–149)
VLDL Cholesterol Cal: 23 mg/dL (ref 5–40)

## 2023-03-06 ENCOUNTER — Other Ambulatory Visit: Payer: Self-pay

## 2023-03-06 DIAGNOSIS — I422 Other hypertrophic cardiomyopathy: Secondary | ICD-10-CM

## 2023-03-06 DIAGNOSIS — I340 Nonrheumatic mitral (valve) insufficiency: Secondary | ICD-10-CM

## 2023-06-24 ENCOUNTER — Other Ambulatory Visit: Payer: Self-pay | Admitting: Cardiology

## 2023-06-24 DIAGNOSIS — I422 Other hypertrophic cardiomyopathy: Secondary | ICD-10-CM

## 2023-06-29 ENCOUNTER — Other Ambulatory Visit: Payer: Self-pay | Admitting: Cardiology

## 2023-06-29 DIAGNOSIS — I422 Other hypertrophic cardiomyopathy: Secondary | ICD-10-CM

## 2023-07-01 ENCOUNTER — Other Ambulatory Visit: Payer: Self-pay

## 2023-07-01 ENCOUNTER — Other Ambulatory Visit: Payer: Self-pay | Admitting: Cardiology

## 2023-07-01 ENCOUNTER — Ambulatory Visit (HOSPITAL_COMMUNITY): Payer: 59

## 2023-07-01 DIAGNOSIS — I422 Other hypertrophic cardiomyopathy: Secondary | ICD-10-CM

## 2023-07-10 ENCOUNTER — Ambulatory Visit: Payer: Self-pay | Admitting: Cardiology

## 2023-07-12 ENCOUNTER — Ambulatory Visit: Payer: Self-pay | Admitting: Cardiology

## 2023-07-12 ENCOUNTER — Other Ambulatory Visit: Payer: Self-pay | Admitting: Nurse Practitioner

## 2023-07-12 ENCOUNTER — Ambulatory Visit: Payer: Self-pay

## 2023-07-12 DIAGNOSIS — M25522 Pain in left elbow: Secondary | ICD-10-CM

## 2023-07-26 ENCOUNTER — Encounter: Payer: Self-pay | Admitting: Cardiology

## 2023-07-26 ENCOUNTER — Ambulatory Visit (HOSPITAL_COMMUNITY): Payer: 59 | Attending: Cardiology

## 2023-07-26 DIAGNOSIS — I422 Other hypertrophic cardiomyopathy: Secondary | ICD-10-CM | POA: Insufficient documentation

## 2023-07-26 DIAGNOSIS — I340 Nonrheumatic mitral (valve) insufficiency: Secondary | ICD-10-CM | POA: Insufficient documentation

## 2023-07-26 LAB — ECHOCARDIOGRAM COMPLETE
Area-P 1/2: 2.77 cm2
MV M vel: 7.54 m/s
MV Peak grad: 227.4 mm[Hg]
S' Lateral: 2.3 cm

## 2023-07-26 MED ORDER — PERFLUTREN LIPID MICROSPHERE
1.0000 mL | INTRAVENOUS | Status: AC | PRN
  Administered 2023-07-26: 2 mL via INTRAVENOUS

## 2023-07-30 ENCOUNTER — Other Ambulatory Visit: Payer: Self-pay | Admitting: Cardiology

## 2023-07-30 DIAGNOSIS — I422 Other hypertrophic cardiomyopathy: Secondary | ICD-10-CM

## 2023-08-02 ENCOUNTER — Other Ambulatory Visit: Payer: Self-pay | Admitting: Cardiology

## 2023-08-02 DIAGNOSIS — I422 Other hypertrophic cardiomyopathy: Secondary | ICD-10-CM

## 2023-08-08 ENCOUNTER — Other Ambulatory Visit: Payer: Self-pay | Admitting: Cardiology

## 2023-08-08 DIAGNOSIS — E782 Mixed hyperlipidemia: Secondary | ICD-10-CM

## 2023-09-03 ENCOUNTER — Ambulatory Visit: Payer: Self-pay | Attending: Cardiology | Admitting: Cardiology

## 2023-09-03 ENCOUNTER — Encounter: Payer: Self-pay | Admitting: Cardiology

## 2023-09-03 VITALS — BP 106/60 | HR 59 | Ht 65.0 in | Wt 205.6 lb

## 2023-09-03 DIAGNOSIS — E782 Mixed hyperlipidemia: Secondary | ICD-10-CM | POA: Diagnosis not present

## 2023-09-03 DIAGNOSIS — I340 Nonrheumatic mitral (valve) insufficiency: Secondary | ICD-10-CM | POA: Diagnosis not present

## 2023-09-03 DIAGNOSIS — I422 Other hypertrophic cardiomyopathy: Secondary | ICD-10-CM

## 2023-09-03 MED ORDER — DILTIAZEM HCL ER COATED BEADS 240 MG PO CP24
ORAL_CAPSULE | ORAL | 3 refills | Status: AC
Start: 2023-09-03 — End: ?

## 2023-09-03 MED ORDER — ATORVASTATIN CALCIUM 80 MG PO TABS: 80.0000 mg | ORAL_TABLET | Freq: Every day | ORAL | 3 refills | Status: AC

## 2023-09-03 MED ORDER — EZETIMIBE 10 MG PO TABS: 10.0000 mg | ORAL_TABLET | Freq: Every day | ORAL | 3 refills | Status: AC

## 2023-09-03 MED ORDER — ATENOLOL 50 MG PO TABS: ORAL_TABLET | ORAL | 3 refills | Status: DC

## 2023-09-03 NOTE — Progress Notes (Signed)
 Cardiology Office Note:  .   Date:  09/03/2023  ID:  Tina Houston, DOB [birth year], MRN [id number] PCP: Daisy Floro, MD  [facility] Providers Cardiologist:  Truett Mainland, MD PCP: Daisy Floro, MD  Chief Complaint  Patient presents with   Cardiomyopathy      History of Present Illness: .    Tina Houston is a 63 y.o. female with hypertrophic cardiomyopathy, hyperlipidemia   Patient is doing well.  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  As part of her work, she stays active and on her feet all the time.  Denies any complaint of chest pain or shortness of breath.  She denies any presyncope or syncope symptoms.  She is compliant with her medical therapy.  She drinks 4 bottles of 32 ounces of water every day.  Vitals:   09/03/23 0801  BP: 106/60  Pulse: (!) 59  SpO2: 93%     ROS:  Review of Systems  Cardiovascular:  Negative for chest pain, dyspnea on exertion, leg swelling, palpitations and syncope.     Studies Reviewed: Marland Kitchen        EKG 09/03/2023: Sinus rhythm 59 bpm Left axis deviation Moderate voltage criteria for LVH, may be normal variant ( R in aVL , Cornell product ) ST & T wave abnormality, consider inferolateral ischemia When compared with ECG of 07/09/2022, No significant change was found   Independently interpreted 10/2022: Chol 144, TG 130, HDL 35, LDL 86 Hb 12.1 Cr 0.8, K 4.9     Echocardiogram 07/26/2023: 1. There is severe concentric LVH with prominence of the basilar septal segment (2.0cm) and mild SAM consistent with HCM. No significant LVOT gradient at rest or with Valsalva. Left ventricular ejection fraction, by estimation, is 65 to 70%. The left ventricle has normal function. The left ventricle has no regional wall motion abnormalities. There is severe left ventricular hypertrophy. Left ventricular diastolic parameters are consistent with Grade III diastolic dysfunction (restrictive).   2. Right  ventricular systolic function is normal. The right ventricular  size is normal.   3. Left atrial size was severely dilated.   4. Right atrial size was mildly dilated.   5. SAM is present. The mitral valve is normal in structure. Mild mitral  valve regurgitation. No evidence of mitral stenosis. Moderate mitral  annular calcification.   6. The aortic valve is tricuspid. There is moderate calcification of the  aortic valve. Aortic valve regurgitation is not visualized. No aortic  stenosis is present.   7. The inferior vena cava is normal in size with greater than 50%  respiratory variability, suggesting right atrial pressure of 3 mmHg.     Physical Exam:   Physical Exam Vitals and nursing note reviewed.  Constitutional:      General: She is not in acute distress. Neck:     Vascular: No JVD.  Cardiovascular:     Rate and Rhythm: Normal rate and regular rhythm.     Heart sounds: Murmur heard.     Harsh holosystolic murmur is present with a grade of 3/6 at the upper right sternal border.  Pulmonary:     Effort: Pulmonary effort is normal.     Breath sounds: Normal breath sounds. No wheezing or rales.  Musculoskeletal:     Right lower leg: No edema.     Left lower leg: No edema.      VISIT DIAGNOSES:   ICD-10-CM   1. Hypertrophic cardiomyopathy (HCC)  I42.2 EKG 12-Lead    2. Nonrheumatic mitral valve regurgitation  I34.0 EKG 12-Lead    3. Mixed hyperlipidemia  E78.2 EKG 12-Lead       ASSESSMENT AND PLAN: .    Tina Houston is a 63 y.o. female with hypertrophic cardiomyopathy, hyperlipidemia   Hypertrophic cardiomyopathy: Stable, no symptoms on current medical therapy. Murmur on exam, but no gradient on echocardiogram. Continue diltiazem to 240 mg daily, atenolol 50 mg twice daily.   On optimal medical management with no indication for hemodialysis or any ablative treatment. No indication for ICD.   Mitral regurgitation: Moderate. Clinically asymptomatic. No  indication for early repair. Continue to monitor.    Mixed hyperlipidemia: Calcium score 2.7 (07/2018) Continue Lipitor 80 mg, Zetia 10 mg daily.   Check lipid panel.  Hypertension: Well-controlled. Check BMP.  Given overall stability, I will not check echocardiogram before the next visit in 1 year.  I will repeat echocardiogram in summer 2026.  Will order at next visit.    Meds ordered this encounter  Medications   atenolol (TENORMIN) 50 MG tablet    Sig: TAKE 1 TABLET(50 MG) BY MOUTH TWICE DAILY    Dispense:  180 tablet    Refill:  3    Pt must keep upcoming appt in March 2025 with Dr. Rosemary Holms before anymore refills. Thank you Final Attempt   atorvastatin (LIPITOR) 80 MG tablet    Sig: Take 1 tablet (80 mg total) by mouth daily.    Dispense:  90 tablet    Refill:  3   diltiazem (CARDIZEM CD) 240 MG 24 hr capsule    Sig: TAKE 1 CAPSULE(240 MG) BY MOUTH DAILY    Dispense:  90 capsule    Refill:  3    Pt must keep upcoming appt in January 2025 with Dr. Rosemary Holms before anymore refills. Thanks you Final Attempt   ezetimibe (ZETIA) 10 MG tablet    Sig: Take 1 tablet (10 mg total) by mouth daily.    Dispense:  90 tablet    Refill:  3    Please keep upcoming appointment for future refills. Thank you     F/u in 1 year  Signed, Elder Negus, MD

## 2023-09-03 NOTE — Patient Instructions (Signed)
 Medication Instructions:  Your physician recommends that you continue on your current medications as directed. Please refer to the Current Medication list given to you today.  *If you need a refill on your cardiac medications before your next appointment, please call your pharmacy*   Lab Work: Lipid Panel and BMP  If you have labs (blood work) drawn today and your tests are completely normal, you will receive your results only by: MyChart Message (if you have MyChart) OR A paper copy in the mail If you have any lab test that is abnormal or we need to change your treatment, we will call you to review the results.   Testing/Procedures: NONE   Follow-Up: At [facility], you and your health needs are our priority.  As part of our continuing mission to provide you with exceptional heart care, we have created designated Provider Care Teams.  These Care Teams include your primary Cardiologist (physician) and Advanced Practice Providers (APPs -  Physician Assistants and Nurse Practitioners) who all work together to provide you with the care you need, when you need it.   Your next appointment:   1 year(s)  Provider:   Elder Negus, MD

## 2023-09-05 LAB — LIPID PANEL
Chol/HDL Ratio: 4.6 ratio — ABNORMAL HIGH (ref 0.0–4.4)
Cholesterol, Total: 152 mg/dL (ref 100–199)
HDL: 33 mg/dL — ABNORMAL LOW (ref >39)
LDL Chol Calc (NIH): 94 mg/dL (ref 0–99)
Triglycerides: 142 mg/dL (ref 0–149)
VLDL Cholesterol Cal: 25 mg/dL (ref 5–40)

## 2023-09-05 LAB — BASIC METABOLIC PANEL
BUN/Creatinine Ratio: 12 (ref 12–28)
BUN: 12 mg/dL (ref 8–27)
CO2: 24 mmol/L (ref 20–29)
Calcium: 9.4 mg/dL (ref 8.7–10.3)
Chloride: 102 mmol/L (ref 96–106)
Creatinine, Ser: 0.98 mg/dL (ref 0.57–1.00)
Glucose: 105 mg/dL — ABNORMAL HIGH (ref 70–99)
Potassium: 4.4 mmol/L (ref 3.5–5.2)
Sodium: 138 mmol/L (ref 134–144)
eGFR: 64 mL/min/{1.73_m2} (ref >59)

## 2023-09-10 ENCOUNTER — Encounter: Payer: Self-pay | Admitting: Cardiology

## 2023-09-10 ENCOUNTER — Other Ambulatory Visit: Payer: Self-pay

## 2023-09-10 DIAGNOSIS — E782 Mixed hyperlipidemia: Secondary | ICD-10-CM

## 2023-09-10 NOTE — Progress Notes (Signed)
 Please order lipid panel for June 2025, I sent a separate MyChart message about the results to the patient.  Thanks MJP

## 2023-10-25 ENCOUNTER — Other Ambulatory Visit: Payer: Self-pay | Admitting: Cardiology

## 2023-10-25 DIAGNOSIS — I422 Other hypertrophic cardiomyopathy: Secondary | ICD-10-CM

## 2024-02-17 ENCOUNTER — Encounter: Payer: Self-pay | Admitting: Cardiology

## 2024-02-17 NOTE — Telephone Encounter (Signed)
 Anxiety could be contributing to high blood pressures.  Recommend keeping a track.  If remains elevated, we may need to increase atenolol  dose. 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  Thanks MJP

## 2024-02-18 NOTE — Telephone Encounter (Signed)
 Looking better. Continue to check at least couple times a week. We will hold off making any changes to the medications or immediate follow appt for now.  Thanks MJP
